# Patient Record
Sex: Female | Born: 1940 | Race: White | Hispanic: No | Smoking: Never smoker
Health system: Southern US, Community
[De-identification: ages and names within clinical notes are randomized; demographics above are authoritative.]

## PROBLEM LIST (undated history)

## (undated) DIAGNOSIS — Z87442 Personal history of urinary calculi: Secondary | ICD-10-CM

## (undated) DIAGNOSIS — K8020 Calculus of gallbladder without cholecystitis without obstruction: Secondary | ICD-10-CM

## (undated) DIAGNOSIS — N3010 Interstitial cystitis (chronic) without hematuria: Secondary | ICD-10-CM

## (undated) DIAGNOSIS — K5909 Other constipation: Secondary | ICD-10-CM

## (undated) DIAGNOSIS — E079 Disorder of thyroid, unspecified: Secondary | ICD-10-CM

## (undated) DIAGNOSIS — M1990 Unspecified osteoarthritis, unspecified site: Secondary | ICD-10-CM

## (undated) DIAGNOSIS — N189 Chronic kidney disease, unspecified: Secondary | ICD-10-CM

## (undated) DIAGNOSIS — N390 Urinary tract infection, site not specified: Secondary | ICD-10-CM

## (undated) HISTORY — DX: Other constipation: K59.09

## (undated) HISTORY — DX: Personal history of urinary calculi: Z87.442

## (undated) HISTORY — DX: Unspecified osteoarthritis, unspecified site: M19.90

## (undated) HISTORY — PX: PARTIAL HYSTERECTOMY: SHX80

## (undated) HISTORY — DX: Chronic kidney disease, unspecified: N18.9

## (undated) HISTORY — DX: Interstitial cystitis (chronic) without hematuria: N30.10

## (undated) HISTORY — PX: CATARACT EXTRACTION: SUR2

## (undated) HISTORY — PX: EYE SURGERY: SHX253

## (undated) HISTORY — DX: Calculus of gallbladder without cholecystitis without obstruction: K80.20

## (undated) HISTORY — PX: CHOLECYSTECTOMY: SHX55

## (undated) HISTORY — DX: Urinary tract infection, site not specified: N39.0

## (undated) HISTORY — DX: Disorder of thyroid, unspecified: E07.9

## (undated) HISTORY — PX: TUBAL LIGATION: SHX77

---

## 2017-03-01 ENCOUNTER — Encounter: Payer: Self-pay | Admitting: Internal Medicine

## 2017-03-01 LAB — COLOGUARD: Cologuard: NEGATIVE

## 2018-06-12 ENCOUNTER — Encounter: Payer: Self-pay | Admitting: Internal Medicine

## 2019-03-20 ENCOUNTER — Non-Acute Institutional Stay: Payer: Medicare Other | Admitting: Nurse Practitioner

## 2019-03-20 ENCOUNTER — Other Ambulatory Visit: Payer: Self-pay

## 2019-03-20 DIAGNOSIS — K219 Gastro-esophageal reflux disease without esophagitis: Secondary | ICD-10-CM | POA: Insufficient documentation

## 2019-03-20 DIAGNOSIS — N952 Postmenopausal atrophic vaginitis: Secondary | ICD-10-CM

## 2019-03-20 DIAGNOSIS — Z7189 Other specified counseling: Secondary | ICD-10-CM

## 2019-03-20 DIAGNOSIS — E039 Hypothyroidism, unspecified: Secondary | ICD-10-CM

## 2019-03-20 DIAGNOSIS — K5901 Slow transit constipation: Secondary | ICD-10-CM

## 2019-03-20 DIAGNOSIS — Z87440 Personal history of urinary (tract) infections: Secondary | ICD-10-CM

## 2019-03-20 DIAGNOSIS — E559 Vitamin D deficiency, unspecified: Secondary | ICD-10-CM | POA: Diagnosis not present

## 2019-03-20 MED ORDER — PANTOPRAZOLE SODIUM 40 MG PO TBEC: 40.0000 mg | DELAYED_RELEASE_TABLET | Freq: Every day | ORAL | 3 refills | Status: DC

## 2019-03-20 NOTE — Patient Instructions (Signed)
F/u in clinic [place] in 3 months. Obtain CBC/diff, CMP/eGFR, TSH, Lipid panel, Vit D next week.

## 2019-03-20 NOTE — Assessment & Plan Note (Signed)
Many treatments in the past.

## 2019-03-20 NOTE — Assessment & Plan Note (Signed)
Stable, on Lactulose 29ml po qd.

## 2019-03-20 NOTE — Assessment & Plan Note (Signed)
03/20/19 MOST limited additional interventions, determine use or limitation of antibiotics when infection occurs, IVF for a defined trail period.

## 2019-03-20 NOTE — Assessment & Plan Note (Signed)
Failed Pepcid, will try Pantoprazole 40mg  qd.

## 2019-03-20 NOTE — Progress Notes (Signed)
Provider:  Marlana Latus NP Location:   clinic [place]    Place of Service:  Clinic (12)clinic [facility]  PCP: System, Pcp Not In Patient Care Team: System, Pcp Not In as PCP - General  Extended Emergency Contact Information Primary Emergency Contact: Lyda, [place] Mobile Phone: [phone] Relation: Son  Code Status: DNR Goals of Care: Advanced Directive information No flowsheet data found.    Chief Complaint  Patient presents with  . New Admit To SNF    Patient here today to establish care.     HPI: Patient is a 78 y.o. female seen today for admission to [facility] service  The patient has Hx of Hypothyroidism, taking Levothyroxine 57mg qd, no recent TSH. Constipation, stable, on Lactulose 150mqd. GERD failed Pepcid. Atrophic Vaginitis, 3x/wk, PV Estradiol. She takes Vit D OTC for supplement.   History reviewed. No pertinent past medical history.   has no history on file for tobacco, alcohol, and drug. Social History   Socioeconomic History  . Marital status: Widowed    Spouse name: Not on file  . Number of children: Not on file  . Years of education: Not on file  . Highest education level: Not on file  Occupational History  . Not on file  Social Needs  . Financial resource strain: Not on file  . Food insecurity    Worry: Not on file    Inability: Not on file  . Transportation needs    Medical: Not on file    Non-medical: Not on file  Tobacco Use  . Smoking status: Not on file  Substance and Sexual Activity  . Alcohol use: Not on file  . Drug use: Not on file  . Sexual activity: Not on file  Lifestyle  . Physical activity    Days per week: Not on file    Minutes per session: Not on file  . Stress: Not on file  Relationships  . Social coHerbalistn phone: Not on file    Gets together: Not on file    Attends religious service: Not on file    Active member of club or organization: Not on file    Attends meetings of clubs or organizations: Not on file   Relationship status: Not on file  . Intimate partner violence    Fear of current or ex partner: Not on file    Emotionally abused: Not on file    Physically abused: Not on file    Forced sexual activity: Not on file  Other Topics Concern  . Not on file  Social History Narrative  . Not on file    Functional Status Survey:    History reviewed. No pertinent family history.  Health Maintenance  Topic Date Due  . TETANUS/TDAP  11/07/1957  . DEXA SCAN  11/08/2003  . PNA vac Low Risk Adult (1 of 2 - PCV13) 11/08/2003  . INFLUENZA VACCINE  Completed    Not on File  Allergies as of 03/20/2019   Not on File     Medication List       Accurate as of March 20, 2019 11:59 PM. If you have any questions, ask your nurse or doctor.        levothyroxine 25 MCG tablet Commonly known as: SYNTHROID Take 25 mcg by mouth daily before breakfast.   pantoprazole 40 MG tablet Commonly known as: PROTONIX Take 1 tablet (40 mg total) by mouth daily. Started by: Amra Shukla X Kaylin Schellenberg, NP  Review of Systems  Constitutional: Negative for activity change, appetite change, chills, diaphoresis and fatigue.  HENT: Positive for hearing loss. Negative for congestion and voice change.   Respiratory: Negative for cough, shortness of breath and wheezing.   Cardiovascular: Negative for chest pain, palpitations and leg swelling.  Gastrointestinal: Negative for abdominal distention, constipation, diarrhea, nausea and vomiting.       Heart burns, bloated sometimes.   Genitourinary: Negative for difficulty urinating, dysuria, frequency, hematuria, urgency, vaginal bleeding, vaginal discharge and vaginal pain.  Musculoskeletal: Negative for gait problem.  Skin: Negative for color change and pallor.  Neurological: Negative for dizziness, speech difficulty, weakness and headaches.  Psychiatric/Behavioral: Negative for agitation, behavioral problems, hallucinations and sleep disturbance. The patient is not  nervous/anxious.     Vitals:   03/20/19 1534  BP: 126/78  Pulse: 82  Temp: (!) 97.1 F (36.2 C)  SpO2: 97%  Weight: 141 lb (64 kg)   There is no height or weight on file to calculate BMI. Physical Exam Vitals signs and nursing note reviewed.  Constitutional:      General: She is not in acute distress.    Appearance: Normal appearance. She is normal weight. She is not ill-appearing, toxic-appearing or diaphoretic.  HENT:     Head: Normocephalic and atraumatic.     Nose: Nose normal.     Mouth/Throat:     Mouth: Mucous membranes are moist.  Eyes:     Extraocular Movements: Extraocular movements intact.     Conjunctiva/sclera: Conjunctivae normal.     Pupils: Pupils are equal, round, and reactive to light.  Neck:     Musculoskeletal: Normal range of motion and neck supple.  Cardiovascular:     Rate and Rhythm: Normal rate and regular rhythm.     Heart sounds: No murmur.  Pulmonary:     Breath sounds: Normal breath sounds. No wheezing, rhonchi or rales.  Abdominal:     General: Bowel sounds are normal. There is no distension.     Palpations: Abdomen is soft.     Tenderness: There is no abdominal tenderness. There is no right CVA tenderness, left CVA tenderness, guarding or rebound.  Musculoskeletal:     Right lower leg: No edema.     Left lower leg: No edema.     Comments: kyphoscoliosis   Skin:    General: Skin is warm and dry.  Neurological:     General: No focal deficit present.     Mental Status: She is alert and oriented to person, place, and time. Mental status is at baseline.     Cranial Nerves: No cranial nerve deficit.     Motor: No weakness.     Coordination: Coordination normal.     Gait: Gait normal.  Psychiatric:        Mood and Affect: Mood normal.        Behavior: Behavior normal.        Thought Content: Thought content normal.        Judgment: Judgment normal.     Labs reviewed: Basic Metabolic Panel: No results for input(s): NA, K, CL, CO2,  GLUCOSE, BUN, CREATININE, CALCIUM, MG, PHOS in the last 8760 hours. Liver Function Tests: No results for input(s): AST, ALT, ALKPHOS, BILITOT, PROT, ALBUMIN in the last 8760 hours. No results for input(s): LIPASE, AMYLASE in the last 8760 hours. No results for input(s): AMMONIA in the last 8760 hours. CBC: No results for input(s): WBC, NEUTROABS, HGB, HCT, MCV, PLT in the last  8760 hours. Cardiac Enzymes: No results for input(s): CKTOTAL, CKMB, CKMBINDEX, TROPONINI in the last 8760 hours. BNP: Invalid input(s): POCBNP No results found for: HGBA1C No results found for: TSH No results found for: VITAMINB12 No results found for: FOLATE No results found for: IRON, TIBC, FERRITIN  Imaging and Procedures obtained prior to SNF admission: Patient was never admitted.  Assessment/Plan  Advance care planning 03/20/19 MOST limited additional interventions, determine use or limitation of antibiotics when infection occurs, IVF for a defined trail period.   Hypothyroidism Taking Levothyroxine 78mg qd, no TSH this year, her previous PCP Dr. [place]Adalberto [place] # [phone] Update TSH  Vitamin D deficiency Taking Vit D supplement at home, will update Vit D level.   Atrophic vaginitis Estradiol 3x/wk PV  History of recurrent UTIs Many treatments in the past.   Slow transit constipation Stable, on Lactulose 188mpo qd.   GERD (gastroesophageal reflux disease) Failed Pepcid, will try Pantoprazole 4048md.   Family/ staff Communication: plan of care reviewed with the patient and charge nurse.   Labs/tests ordered: CBC/diff, CMP/eGFR, TSH, Lipid panel, Vit D next week

## 2019-03-20 NOTE — Assessment & Plan Note (Signed)
Estradiol 3x/wk PV

## 2019-03-20 NOTE — Assessment & Plan Note (Signed)
Taking Levothyroxine 56mcg qd, no TSH this year, her previous PCP Dr. Adalberto [place] # [phone]. Update TSH

## 2019-03-20 NOTE — Assessment & Plan Note (Signed)
Taking Vit D supplement at home, will update Vit D level.

## 2019-03-24 ENCOUNTER — Encounter: Payer: Self-pay | Admitting: Nurse Practitioner

## 2019-04-01 ENCOUNTER — Encounter: Payer: Medicare Other | Admitting: Nurse Practitioner

## 2019-04-01 ENCOUNTER — Other Ambulatory Visit: Payer: Self-pay

## 2019-04-01 DIAGNOSIS — E039 Hypothyroidism, unspecified: Secondary | ICD-10-CM

## 2019-04-01 DIAGNOSIS — E559 Vitamin D deficiency, unspecified: Secondary | ICD-10-CM

## 2019-04-03 ENCOUNTER — Telehealth: Payer: Self-pay

## 2019-04-03 MED ORDER — FAMOTIDINE 20 MG PO TABS: 20.0000 mg | ORAL_TABLET | Freq: Every day | ORAL | 2 refills | Status: DC

## 2019-04-03 NOTE — Telephone Encounter (Signed)
LMOM to return call call with decision on calling in new medication.   Facility no longer is able to dispose of old medications.   Patient called clinic back before closing. She agreed to famotidine and confirmed pharm. Rx sent.

## 2019-04-03 NOTE — Telephone Encounter (Signed)
Patient was a walk in into the clinic stating after reading about the pantoprazole, she is concerned she should not be taking it. According to the patient, it states it is not good for people with osteopenia and she is, possibly. She would like something else.   She would also like to know does the facility have a safe way of disposing medications?

## 2019-04-04 LAB — CBC WITH DIFFERENTIAL/PLATELET
Absolute Monocytes: 347 cells/uL (ref 200–950)
Basophils Absolute: 51 cells/uL (ref 0–200)
Basophils Relative: 1.3 %
Eosinophils Absolute: 160 cells/uL (ref 15–500)
Eosinophils Relative: 4.1 %
HCT: 40 % (ref 35.0–45.0)
Hemoglobin: 13.7 g/dL (ref 11.7–15.5)
Lymphs Abs: 1147 cells/uL (ref 850–3900)
MCH: 31.6 pg (ref 27.0–33.0)
MCHC: 34.3 g/dL (ref 32.0–36.0)
MCV: 92.4 fL (ref 80.0–100.0)
MPV: 10.1 fL (ref 7.5–12.5)
Monocytes Relative: 8.9 %
Neutro Abs: 2196 cells/uL (ref 1500–7800)
Neutrophils Relative %: 56.3 %
Platelets: 249 10*3/uL (ref 140–400)
RBC: 4.33 10*6/uL (ref 3.80–5.10)
RDW: 12 % (ref 11.0–15.0)
Total Lymphocyte: 29.4 %
WBC: 3.9 10*3/uL (ref 3.8–10.8)

## 2019-04-04 LAB — COMPLETE METABOLIC PANEL WITH GFR
AG Ratio: 1.8 (calc) (ref 1.0–2.5)
ALT: 24 U/L (ref 6–29)
AST: 25 U/L (ref 10–35)
Albumin: 4.2 g/dL (ref 3.6–5.1)
Alkaline phosphatase (APISO): 59 U/L (ref 37–153)
BUN: 10 mg/dL (ref 7–25)
CO2: 26 mmol/L (ref 20–32)
Calcium: 9.1 mg/dL (ref 8.6–10.4)
Chloride: 101 mmol/L (ref 98–110)
Creat: 0.68 mg/dL (ref 0.60–0.88)
GFR, Est African American: 96 mL/min/{1.73_m2} (ref >=60)
GFR, Est Non African American: 83 mL/min/{1.73_m2} (ref >=60)
Globulin: 2.4 g/dL (calc) (ref 1.9–3.7)
Glucose, Bld: 109 mg/dL — ABNORMAL HIGH (ref 65–99)
Potassium: 4.4 mmol/L (ref 3.5–5.3)
Sodium: 136 mmol/L (ref 135–146)
Total Bilirubin: 0.4 mg/dL (ref 0.2–1.2)
Total Protein: 6.6 g/dL (ref 6.1–8.1)

## 2019-04-04 LAB — VITAMIN D 1,25 DIHYDROXY
Vitamin D 1, 25 (OH)2 Total: 45 pg/mL (ref 18–72)
Vitamin D2 1, 25 (OH)2: <8 pg/mL
Vitamin D3 1, 25 (OH)2: 45 pg/mL

## 2019-04-04 LAB — LIPID PANEL
Cholesterol: 212 mg/dL — ABNORMAL HIGH (ref <200)
HDL: 71 mg/dL (ref >=50)
LDL Cholesterol (Calc): 120 mg/dL (calc) — ABNORMAL HIGH
Non-HDL Cholesterol (Calc): 141 mg/dL (calc) — ABNORMAL HIGH (ref <130)
Total CHOL/HDL Ratio: 3 (calc) (ref <5.0)
Triglycerides: 104 mg/dL (ref <150)

## 2019-04-04 LAB — TSH: TSH: 5.82 mIU/L — ABNORMAL HIGH (ref 0.40–4.50)

## 2019-04-08 ENCOUNTER — Encounter: Payer: Self-pay | Admitting: Nurse Practitioner

## 2019-04-08 ENCOUNTER — Other Ambulatory Visit: Payer: Self-pay | Admitting: Nurse Practitioner

## 2019-04-08 DIAGNOSIS — E785 Hyperlipidemia, unspecified: Secondary | ICD-10-CM | POA: Insufficient documentation

## 2019-04-08 DIAGNOSIS — E039 Hypothyroidism, unspecified: Secondary | ICD-10-CM

## 2019-04-08 NOTE — Progress Notes (Signed)
This encounter was created in error - please disregard.

## 2019-05-29 ENCOUNTER — Non-Acute Institutional Stay: Payer: Medicare Other | Admitting: Nurse Practitioner

## 2019-05-29 ENCOUNTER — Telehealth: Payer: Self-pay

## 2019-05-29 ENCOUNTER — Other Ambulatory Visit: Payer: Self-pay

## 2019-05-29 VITALS — BP 138/82 | HR 79 | Temp 96.8°F | Ht 62.0 in | Wt 144.0 lb

## 2019-05-29 DIAGNOSIS — K219 Gastro-esophageal reflux disease without esophagitis: Secondary | ICD-10-CM | POA: Diagnosis not present

## 2019-05-29 DIAGNOSIS — K5901 Slow transit constipation: Secondary | ICD-10-CM

## 2019-05-29 DIAGNOSIS — E785 Hyperlipidemia, unspecified: Secondary | ICD-10-CM | POA: Diagnosis not present

## 2019-05-29 DIAGNOSIS — E039 Hypothyroidism, unspecified: Secondary | ICD-10-CM | POA: Diagnosis not present

## 2019-05-29 DIAGNOSIS — R635 Abnormal weight gain: Secondary | ICD-10-CM | POA: Insufficient documentation

## 2019-05-29 MED ORDER — FAMOTIDINE 20 MG PO TABS: 20.0000 mg | ORAL_TABLET | Freq: Two times a day (BID) | ORAL | 2 refills | Status: DC

## 2019-05-29 NOTE — Assessment & Plan Note (Signed)
Stable, continue Lactulose

## 2019-05-29 NOTE — Assessment & Plan Note (Signed)
LDL 120 04/01/19, declined statin

## 2019-05-29 NOTE — Assessment & Plan Note (Signed)
Baseline weight #130s, about #10 Ibs weight gain since moving in to [place] [facility], diet/exercise, will check TSH Free T3/T4.

## 2019-05-29 NOTE — Assessment & Plan Note (Signed)
Better, but not well controlled, will increase Famotidine 20mg  bid. Observe.

## 2019-05-29 NOTE — Assessment & Plan Note (Signed)
TSH 5.82 03/27/19, continue Levothyroxine qd, update TSH, Free T3//T4

## 2019-05-29 NOTE — Telephone Encounter (Signed)
Patient was a no show to clinic. Called patient to reschedule, LMOM to return call.

## 2019-05-29 NOTE — Progress Notes (Signed)
Location:   clinic [place]   Place of Service:  Clinic (12) Provider: Marlana Latus NP  Code Status: DNR Goals of Care: No flowsheet data found.   Chief Complaint  Patient presents with  . Medical Management of Chronic Issues    3 month follow up and discuss labs from November.      HPI: Patient is a 79 y.o. female seen today for medical management of chronic diseases.    The patient has Hx of GERD, better, but not well controled, on  Famotidine 20mg  qd. Hypothyroidism, TSH 5s 03/2019, on Levothyroxine 19mcg qd, TSH 5.82 04/01/19. LDL 120. C/o weight gained about #10Ibs since moved into [place] [facility], no apparent fluid retention.    No past medical history on file.  No past surgical history on file.  No Known Allergies  Allergies as of 05/29/2019   No Known Allergies     Medication List       Accurate as of May 29, 2019 11:59 PM. If you have any questions, ask your nurse or doctor.        STOP taking these medications   pantoprazole 40 MG tablet Commonly known as: PROTONIX Stopped by: Urvi Imes X Khadeem Rockett, NP     TAKE these medications   estradiol 0.5 MG tablet Commonly known as: ESTRACE Take 0.25 mg by mouth daily. 3 times a week   famotidine 20 MG tablet Commonly known as: PEPCID Take 1 tablet (20 mg total) by mouth 2 (two) times daily. What changed: when to take this Changed by: Alani Sabbagh X Lamari Beckles, NP   levothyroxine 25 MCG tablet Commonly known as: SYNTHROID Take 25 mcg by mouth daily before breakfast.       Review of Systems:  Review of Systems  Constitutional: Positive for unexpected weight change. Negative for activity change, appetite change, chills, diaphoresis, fatigue and fever.  HENT: Positive for hearing loss. Negative for congestion and voice change.   Eyes: Negative for visual disturbance.  Respiratory: Negative for cough, shortness of breath and wheezing.   Cardiovascular: Negative for chest pain, palpitations and leg swelling.  Gastrointestinal: Negative for  abdominal distention, abdominal pain, constipation, diarrhea, nausea and vomiting.       Heart burns  Genitourinary: Negative for difficulty urinating, dysuria and urgency.       Urination x1/night.   Musculoskeletal: Negative for gait problem.  Skin: Negative for color change and pallor.  Neurological: Negative for dizziness, speech difficulty, weakness and headaches.  Psychiatric/Behavioral: Positive for sleep disturbance. Negative for agitation, behavioral problems and hallucinations. The patient is not nervous/anxious.        5 hours average sleep at night.     Health Maintenance  Topic Date Due  . TETANUS/TDAP  11/07/1957  . DEXA SCAN  11/08/2003  . PNA vac Low Risk Adult (1 of 2 - PCV13) 11/08/2003  . INFLUENZA VACCINE  Completed    Physical Exam: Vitals:   05/29/19 1425  BP: 138/82  Pulse: 79  Temp: (!) 96.8 F (36 C)  SpO2: 98%  Weight: 144 lb (65.3 kg)  Height: 5\' 2"  (1.575 m)   Body mass index is 26.34 kg/m. Physical Exam Vitals and nursing note reviewed.  Constitutional:      General: She is not in acute distress.    Appearance: Normal appearance. She is not ill-appearing, toxic-appearing or diaphoretic.     Comments: Over weight.   HENT:     Head: Normocephalic and atraumatic.     Nose: Nose normal.  Mouth/Throat:     Mouth: Mucous membranes are moist.  Eyes:     Extraocular Movements: Extraocular movements intact.     Conjunctiva/sclera: Conjunctivae normal.     Pupils: Pupils are equal, round, and reactive to light.  Cardiovascular:     Rate and Rhythm: Normal rate and regular rhythm.     Heart sounds: No murmur.  Pulmonary:     Effort: Pulmonary effort is normal.     Breath sounds: No wheezing, rhonchi or rales.  Abdominal:     General: Bowel sounds are normal. There is no distension.     Palpations: Abdomen is soft.     Tenderness: There is no abdominal tenderness. There is no right CVA tenderness, left CVA tenderness, guarding or rebound.    Musculoskeletal:     Cervical back: Normal range of motion and neck supple.     Right lower leg: No edema.     Left lower leg: No edema.  Skin:    General: Skin is warm and dry.  Neurological:     General: No focal deficit present.     Mental Status: She is alert and oriented to person, place, and time. Mental status is at baseline.     Motor: No weakness.     Coordination: Coordination normal.     Gait: Gait normal.  Psychiatric:        Mood and Affect: Mood normal.        Behavior: Behavior normal.        Thought Content: Thought content normal.        Judgment: Judgment normal.     Labs reviewed: Basic Metabolic Panel: Recent Labs    04/01/19 0700  NA 136  K 4.4  CL 101  CO2 26  GLUCOSE 109*  BUN 10  CREATININE 0.68  CALCIUM 9.1  TSH 5.82*   Liver Function Tests: Recent Labs    04/01/19 0700  AST 25  ALT 24  BILITOT 0.4  PROT 6.6   No results for input(s): LIPASE, AMYLASE in the last 8760 hours. No results for input(s): AMMONIA in the last 8760 hours. CBC: Recent Labs    04/01/19 0700  WBC 3.9  NEUTROABS 2,196  HGB 13.7  HCT 40.0  MCV 92.4  PLT 249   Lipid Panel: Recent Labs    04/01/19 0700  CHOL 212*  HDL 71  LDLCALC 120*  TRIG 104  CHOLHDL 3.0   No results found for: HGBA1C  Procedures since last visit: No results found.  Assessment/Plan  GERD (gastroesophageal reflux disease) Better, but not well controlled, will increase Famotidine 20mg  bid. Observe.   Slow transit constipation Stable, continue Lactulose   Hypothyroidism TSH 5.82 03/27/19, continue Levothyroxine 13/5/20 qd, update TSH, Free T3//T4  Hyperlipidemia LDL 120 04/01/19, declined statin  Weight gain Baseline weight #130s, about #10 Ibs weight gain since moving in to [place] [facility], diet/exercise, will check TSH Free T3/T4.    Labs/tests ordered: pending TSH Free T4, T3 4 weeks.   Next appt:  4 months

## 2019-05-29 NOTE — Patient Instructions (Addendum)
Diet, exercise, weight monitoring, check Free T3/T4, TSH in 4 weeks. F/u in clinic in 4 months.

## 2019-05-30 ENCOUNTER — Encounter: Payer: Self-pay | Admitting: Nurse Practitioner

## 2019-06-26 ENCOUNTER — Non-Acute Institutional Stay: Payer: Medicare Other

## 2019-06-26 ENCOUNTER — Other Ambulatory Visit: Payer: Self-pay

## 2019-06-26 DIAGNOSIS — E039 Hypothyroidism, unspecified: Secondary | ICD-10-CM

## 2019-06-27 LAB — TSH+FREE T4: TSH W/REFLEX TO FT4: 6.26 mIU/L — ABNORMAL HIGH (ref 0.40–4.50)

## 2019-06-27 LAB — T3, FREE: T3, Free: 2.7 pg/mL (ref 2.3–4.2)

## 2019-06-27 LAB — T4, FREE: Free T4: 1 ng/dL (ref 0.8–1.8)

## 2019-07-01 ENCOUNTER — Other Ambulatory Visit: Payer: Self-pay | Admitting: Nurse Practitioner

## 2019-07-01 DIAGNOSIS — E039 Hypothyroidism, unspecified: Secondary | ICD-10-CM

## 2019-07-01 MED ORDER — LEVOTHYROXINE SODIUM 75 MCG PO TABS: 37.5000 ug | ORAL_TABLET | Freq: Every day | ORAL | 3 refills | Status: DC

## 2019-07-17 ENCOUNTER — Other Ambulatory Visit: Payer: Self-pay

## 2019-07-17 MED ORDER — LACTULOSE 10 GM/15ML PO SOLN: 10.0000 g | Freq: Every day | ORAL | 1 refills | Status: DC

## 2019-07-24 ENCOUNTER — Other Ambulatory Visit: Payer: Self-pay

## 2019-07-24 MED ORDER — LACTULOSE 10 GM/15ML PO SOLN: 10.0000 g | Freq: Every day | ORAL | 3 refills | Status: DC

## 2019-09-25 ENCOUNTER — Non-Acute Institutional Stay: Payer: Medicare Other | Admitting: Nurse Practitioner

## 2019-09-25 ENCOUNTER — Encounter: Payer: Self-pay | Admitting: Gastroenterology

## 2019-09-25 ENCOUNTER — Encounter: Payer: Self-pay | Admitting: Nurse Practitioner

## 2019-09-25 ENCOUNTER — Other Ambulatory Visit: Payer: Self-pay

## 2019-09-25 DIAGNOSIS — E039 Hypothyroidism, unspecified: Secondary | ICD-10-CM

## 2019-09-25 DIAGNOSIS — E785 Hyperlipidemia, unspecified: Secondary | ICD-10-CM | POA: Diagnosis not present

## 2019-09-25 DIAGNOSIS — K219 Gastro-esophageal reflux disease without esophagitis: Secondary | ICD-10-CM

## 2019-09-25 DIAGNOSIS — K5901 Slow transit constipation: Secondary | ICD-10-CM

## 2019-09-25 NOTE — Assessment & Plan Note (Signed)
LDL 120 04/01/19, diet and exercise, declined statin.

## 2019-09-25 NOTE — Assessment & Plan Note (Addendum)
Same weight #144Ibs as in Jan, but more than her usual wt of #130Ibs, failed increase taking 06/26/19 TSH 6.26, levothyroxine was increased to 37.29mcg qd, update TSH 09/18/19 prior to the next appointment. Will start taking Levothyroxine 37.67mcg qd, update TSH 6-8 wks prior to the next appointment.

## 2019-09-25 NOTE — Assessment & Plan Note (Addendum)
Not new but worse bloated, swollen stomach, feels "burning sensation", occasional nauseated, last episode was 6 months ago, better after went to bed.  continue Famotidine, may consider GI referral, no further colonoscopy per pt, but doing coloGuard.

## 2019-09-25 NOTE — Assessment & Plan Note (Signed)
Lactulose

## 2019-09-25 NOTE — Patient Instructions (Addendum)
Start taking Levothyroxine 37.59mcg daily, repeat TSH prior to the next appointment Dr. Chales Abrahams 3-4 months. Will consider GI referral for bloated abd with non specific symptoms.

## 2019-09-25 NOTE — Progress Notes (Signed)
Location:   clinic [facility]   Place of Service:  Clinic (12) Provider: Chipper Oman NP  Code Status: DNR Goals of Care: No flowsheet data found.   Chief Complaint  Patient presents with  . Medical Management of Chronic Issues    Patient returns to clinic for follow up. She complains of still having acid issues. She is due for eye exam. She does not currently see an eye doctor. Patient would like to discuss getting a mammogram and a stool test. She believes she may be due for TDAP.   Marland Kitchen Health Maintenance    TDAP, PNA vaccinations patient unsure of last ones.     HPI: Patient is a 79 y.o. female seen today for medical management of chronic diseases.    Hx of GERD, stable, on Famotidine 20mg  bid. Postmenopausal symptoms, managed with Estradiol 0.25mg  qd. Hypothyroidism, trended up TSH from 03/2019 to 06/2019 slightly, the patient is more concern of her weight, on Levothyroxine 07/2019 qd(it was supposed to increase to 06/2019 37.1mcg qd, TSH 5.82 04/01/19, pending TSH ordered 07/01/19 not done.   History reviewed. No pertinent past medical history.  History reviewed. No pertinent surgical history.  No Known Allergies  Allergies as of 09/25/2019   No Known Allergies     Medication List       Accurate as of Sep 25, 2019 11:59 PM. If you have any questions, ask your nurse or doctor.        estradiol 0.5 MG tablet Commonly known as: ESTRACE Take 0.25 mg by mouth daily. 3 times a week   famotidine 20 MG tablet Commonly known as: PEPCID Take 1 tablet (20 mg total) by mouth 2 (two) times daily.   lactulose 10 GM/15ML solution Commonly known as: CHRONULAC Take 15 mLs (10 g total) by mouth at bedtime.   levothyroxine 75 MCG tablet Commonly known as: SYNTHROID Take 0.5 tablets (37.5 mcg total) by mouth daily before breakfast.       Review of Systems:  Review of Systems  Constitutional: Positive for unexpected weight change. Negative for activity change, appetite change, fatigue and  fever.  HENT: Positive for hearing loss. Negative for congestion and voice change.   Eyes: Negative for visual disturbance.  Respiratory: Negative for cough and shortness of breath.   Cardiovascular: Negative for leg swelling.  Gastrointestinal: Positive for abdominal distention. Negative for abdominal pain, constipation, diarrhea, nausea and vomiting.       Feels bloated, swollen,  burning sensation occasionally, nauseated last Christmas,   Genitourinary: Negative for difficulty urinating, dysuria and urgency.       Urination x1/night.   Musculoskeletal: Negative for gait problem.  Skin: Negative for color change and pallor.  Neurological: Negative for speech difficulty, weakness and light-headedness.  Psychiatric/Behavioral: Negative for agitation, behavioral problems and sleep disturbance. The patient is not nervous/anxious.        5 hours average sleep at night.     Health Maintenance  Topic Date Due  . TETANUS/TDAP  Never done  . DEXA SCAN  Never done  . PNA vac Low Risk Adult (1 of 2 - PCV13) Never done  . INFLUENZA VACCINE  12/21/2019  . COVID-19 Vaccine  Completed    Physical Exam: Vitals:   09/25/19 1306  BP: 112/72  Pulse: 81  Temp: 97.7 F (36.5 C)  SpO2: 96%  Weight: 144 lb 9.6 oz (65.6 kg)  Height: 5\' 2"  (1.575 m)   Body mass index is 26.45 kg/m. Physical Exam Vitals and  nursing note reviewed.  Constitutional:      Appearance: Normal appearance.     Comments: Over weight.   HENT:     Head: Normocephalic and atraumatic.     Mouth/Throat:     Mouth: Mucous membranes are moist.  Eyes:     Extraocular Movements: Extraocular movements intact.     Conjunctiva/sclera: Conjunctivae normal.     Pupils: Pupils are equal, round, and reactive to light.  Cardiovascular:     Rate and Rhythm: Normal rate and regular rhythm.     Heart sounds: No murmur.  Pulmonary:     Effort: Pulmonary effort is normal.     Breath sounds: No rales.  Abdominal:     General:  Bowel sounds are normal. There is no distension.     Palpations: Abdomen is soft.     Tenderness: There is no abdominal tenderness. There is no right CVA tenderness, left CVA tenderness, guarding or rebound.     Comments: Shifting dullness on percussion.   Musculoskeletal:     Cervical back: Normal range of motion and neck supple.     Right lower leg: No edema.     Left lower leg: No edema.  Skin:    General: Skin is warm and dry.  Neurological:     General: No focal deficit present.     Mental Status: She is alert and oriented to person, place, and time. Mental status is at baseline.     Motor: No weakness.     Coordination: Coordination normal.     Gait: Gait normal.  Psychiatric:        Mood and Affect: Mood normal.        Behavior: Behavior normal.        Thought Content: Thought content normal.        Judgment: Judgment normal.     Labs reviewed: Basic Metabolic Panel: Recent Labs    04/01/19 0700  NA 136  K 4.4  CL 101  CO2 26  GLUCOSE 109*  BUN 10  CREATININE 0.68  CALCIUM 9.1  TSH 5.82*   Liver Function Tests: Recent Labs    04/01/19 0700  AST 25  ALT 24  BILITOT 0.4  PROT 6.6   No results for input(s): LIPASE, AMYLASE in the last 8760 hours. No results for input(s): AMMONIA in the last 8760 hours. CBC: Recent Labs    04/01/19 0700  WBC 3.9  NEUTROABS 2,196  HGB 13.7  HCT 40.0  MCV 92.4  PLT 249   Lipid Panel: Recent Labs    04/01/19 0700  CHOL 212*  HDL 71  LDLCALC 120*  TRIG 104  CHOLHDL 3.0   No results found for: HGBA1C  Procedures since last visit: No results found.  Assessment/Plan  Hypothyroidism Same weight #144Ibs as in Jan, but more than her usual wt of #130Ibs, failed increase taking 06/26/19 TSH 6.26, levothyroxine was increased to 37.34mcg qd, update TSH 09/18/19 prior to the next appointment. Will start taking Levothyroxine 37.70mcg qd, update TSH 6-8 wks prior to the next appointment.   Hyperlipidemia LDL 120  04/01/19, diet and exercise, declined statin.   Slow transit constipation Lactulose.   GERD (gastroesophageal reflux disease) Not new but worse bloated, swollen stomach, feels "burning sensation", occasional nauseated, last episode was 6 months ago, better after went to bed.  continue Famotidine, may consider GI referral, no further colonoscopy per pt, but doing coloGuard.    Labs/tests ordered:  TSH 6-8 wks prior to the  next appointment  Next appt:  4 months with Dr. Chales Abrahams.

## 2019-09-26 ENCOUNTER — Encounter: Payer: Self-pay | Admitting: Nurse Practitioner

## 2019-10-14 ENCOUNTER — Encounter: Payer: Self-pay | Admitting: Gastroenterology

## 2019-10-14 ENCOUNTER — Ambulatory Visit (INDEPENDENT_AMBULATORY_CARE_PROVIDER_SITE_OTHER): Payer: Medicare Other | Admitting: Gastroenterology

## 2019-10-14 VITALS — BP 123/77 | HR 72 | Ht 62.0 in | Wt 138.2 lb

## 2019-10-14 DIAGNOSIS — K5909 Other constipation: Secondary | ICD-10-CM | POA: Diagnosis not present

## 2019-10-14 MED ORDER — LINACLOTIDE 145 MCG PO CAPS
145.0000 ug | ORAL_CAPSULE | Freq: Every day | ORAL | 5 refills | Status: DC
Start: 2019-10-14 — End: 2020-01-26

## 2019-10-14 NOTE — Progress Notes (Signed)
10/14/2019 Claudia Hunt [id number] Jan 10, 1941   HISTORY OF PRESENT ILLNESS: This is a pleasant 79 year old female who is new to our practice.  She actually just moved to the [place] area from [place], [place].  She was referred here by [facility] care in order to discuss issues with constipation.  She tells me that she had tried MiraLAX for quite some time and it was not much help.  She then took Amitiza for a little while, unsure of the dosing, but says that it worked extremely well for her.  She stopped taking it after a while though because it is very expensive for her with her insurance.  Now she is currently taking lactulose, which causes her a lot of GI distress.  She tells me she has never had a colonoscopy in the past.  She says that she had some type of CT scan performed over 20 years ago they told her she had diverticulosis.  She says that she has a very irritable bowel.  She has had 2 negative Cologuards, the last in October 2018 and then one 3 years prior to that.  She says that she was told at one point that due to her abdominal surgeries that she probably had a lot of scar tissue and may not be able to have a colonoscopy.  She describes lower abdominal discomfort/burning and bloating.  Says that the symptoms get better after having a bowel movement.  She denies seeing any blood in her stools.  She denies any weight loss.   Past Medical History:  Diagnosis Date  . Arthritis   . Chronic constipation   . CKD (chronic kidney disease)   . Gallstones   . History of kidney stones   . Interstitial cystitis   . Thyroid disease   . UTI (urinary tract infection)    Past Surgical History:  Procedure Laterality Date  . CHOLECYSTECTOMY    . PARTIAL HYSTERECTOMY    . TUBAL LIGATION      reports that she has never smoked. She has never used smokeless tobacco. She reports that she does not drink alcohol or use drugs. family history includes Cancer in her father;  Colon cancer in her paternal uncle; Diabetes in her father; Prostate cancer in her brother. Allergies  Allergen Reactions  . Ciprofloxacin   . Diflucan [Fluconazole]   . Sulfa Antibiotics       Outpatient Encounter Medications as of 10/14/2019  Medication Sig  . calcium carbonate (OSCAL) 1500 (600 Ca) MG TABS tablet Take 600 mg of elemental calcium by mouth daily with breakfast.  . Cholecalciferol (EQL VITAMIN D3) 50 MCG (2000 UT) CAPS Take 2,000 Units by mouth daily.  Marland Kitchen estradiol (ESTRACE) 0.5 MG tablet Take 0.25 mg by mouth daily. 3 times a week  . famotidine (PEPCID) 20 MG tablet Take 1 tablet (20 mg total) by mouth 2 (two) times daily.  . Glucosamine-Chondroit-Vit C-Mn (GLUCOSAMINE 1500 COMPLEX PO) Take 1,500 mg by mouth daily.  Marland Kitchen lactulose (CHRONULAC) 10 GM/15ML solution Take 15 mLs (10 g total) by mouth at bedtime.  Marland Kitchen levothyroxine (SYNTHROID) 75 MCG tablet Take 0.5 tablets (37.5 mcg total) by mouth daily before breakfast.  . Omega-3 Fatty Acids (FISH OIL PO) Take 2,000 mg by mouth daily.   No facility-administered encounter medications on file as of 10/14/2019.     REVIEW OF SYSTEMS  : All other systems reviewed and negative except where noted in the History of Present Illness.   PHYSICAL  EXAM: BP 123/77   Pulse 72   Ht 5\' 2"  (1.575 m)   Wt 138 lb 3.2 oz (62.7 kg)   SpO2 96%   BMI 25.28 kg/m  General: Well developed white female in no acute distress Head: Normocephalic and atraumatic Eyes:  Sclerae anicteric, conjunctiva pink. Ears: Normal auditory acuity Lungs: Clear throughout to auscultation; no increased WOB. Heart: Regular rate and rhythm; no M/R/G. Abdomen: Soft, non-distended.  BS present.  Non-tender.  Small non-tender umbilical hernia noted. Musculoskeletal: Symmetrical with no gross deformities  Skin: No lesions on visible extremities Extremities: No edema  Neurological: Alert oriented x 4, grossly non-focal Psychological:  Alert and cooperative. Normal  mood and affect  ASSESSMENT AND PLAN: *Chronic constipation with associated lower abdominal discomfort and bloating: Had good results with Amitiza in the past, but was expensive with her insurance.  Is on lactulose currently and it causes a lot of GI distress.  Is looking for something different to try.  We will try Linzess 145 mcg daily.  Can titrate up or down if needed.  Samples and prescription were given.  She will call us back in 4 weeks with an update on her symptoms and sooner if needed. *Colorectal cancer screening: Has never had colonoscopy in the past.  Has had 2 negative Cologuard's.  Is due for Cologuard again in October 2021.  We will put in a recall for that.  Advised that this would likely be her last screening.  CC:  Mast, Man X, NP

## 2019-10-14 NOTE — Patient Instructions (Signed)
If you are age 81 or older, your body mass index should be between 23-30. Your Body mass index is 25.28 kg/m. If this is out of the aforementioned range listed, please consider follow up with your Primary Care Provider.  If you are age 74 or younger, your body mass index should be between 19-25. Your Body mass index is 25.28 kg/m. If this is out of the aformentioned range listed, please consider follow up with your Primary Care Provider.   We have sent the following medications to your pharmacy for you to pick up at your convenience: Linzess 145 mcg daily (samples provided).   Will mail you a letter in October for a repeat Cologuard.   Call back in 4 weeks with an update or sooner if needed ask for Hilma Favors, RN.

## 2019-10-15 NOTE — Progress Notes (Signed)
I agree with the above note, plan however I don't think she needs colon cancer screening in 02/2020.  She will be 79 at that time.

## 2019-10-28 ENCOUNTER — Other Ambulatory Visit: Payer: Self-pay | Admitting: *Deleted

## 2019-10-28 ENCOUNTER — Telehealth: Payer: Self-pay | Admitting: Gastroenterology

## 2019-10-28 DIAGNOSIS — E039 Hypothyroidism, unspecified: Secondary | ICD-10-CM

## 2019-10-28 MED ORDER — LEVOTHYROXINE SODIUM 75 MCG PO TABS: 37.5000 ug | ORAL_TABLET | Freq: Every day | ORAL | 1 refills | Status: DC

## 2019-10-28 NOTE — Telephone Encounter (Signed)
Patient states the linzess is working well for her and would like to try Amitiza instead also said the linzess is too expensive.

## 2019-10-28 NOTE — Telephone Encounter (Signed)
Received request from [facility] mail Order

## 2019-10-28 NOTE — Telephone Encounter (Signed)
The pt has been taking linzess and states it caused her to have diarrhea.  She also states that it is too expensive. She says she has tried [place] before but stopped due to cost.  I advised her to call her insurance company and see which is covered best and we can discuss with Shanda Bumps if it can be sent to the pharmacy. Pt agreed

## 2019-11-04 NOTE — Telephone Encounter (Signed)
FYI

## 2019-11-05 NOTE — Telephone Encounter (Signed)
Miralax 2 or 3 times a day may be a better option as the lactulose can cause a lot of gas and bloating.  Not sure if she had tried Miralax that many times a day in the past.  Thank you,  Sharlynn Oliphant

## 2019-11-05 NOTE — Telephone Encounter (Signed)
The pt will try miralax 3 times daily and call back if she does not have a favorable response.

## 2019-11-13 ENCOUNTER — Other Ambulatory Visit: Payer: Self-pay | Admitting: Nurse Practitioner

## 2020-01-20 ENCOUNTER — Other Ambulatory Visit: Payer: Self-pay

## 2020-01-20 DIAGNOSIS — E039 Hypothyroidism, unspecified: Secondary | ICD-10-CM

## 2020-01-20 LAB — TSH: TSH: 4.8 mIU/L — ABNORMAL HIGH (ref 0.40–4.50)

## 2020-01-23 ENCOUNTER — Other Ambulatory Visit: Payer: Self-pay

## 2020-01-23 ENCOUNTER — Non-Acute Institutional Stay: Payer: Medicare Other | Admitting: Internal Medicine

## 2020-01-23 ENCOUNTER — Encounter: Payer: Self-pay | Admitting: Internal Medicine

## 2020-01-23 VITALS — BP 142/90 | HR 93 | Temp 98.0°F | Ht 62.0 in | Wt 140.8 lb

## 2020-01-23 DIAGNOSIS — E039 Hypothyroidism, unspecified: Secondary | ICD-10-CM

## 2020-01-23 DIAGNOSIS — N952 Postmenopausal atrophic vaginitis: Secondary | ICD-10-CM

## 2020-01-23 DIAGNOSIS — K5901 Slow transit constipation: Secondary | ICD-10-CM | POA: Diagnosis not present

## 2020-01-23 DIAGNOSIS — K219 Gastro-esophageal reflux disease without esophagitis: Secondary | ICD-10-CM | POA: Diagnosis not present

## 2020-01-23 DIAGNOSIS — E785 Hyperlipidemia, unspecified: Secondary | ICD-10-CM

## 2020-01-23 DIAGNOSIS — Z1231 Encounter for screening mammogram for malignant neoplasm of breast: Secondary | ICD-10-CM

## 2020-01-23 DIAGNOSIS — M8580 Other specified disorders of bone density and structure, unspecified site: Secondary | ICD-10-CM

## 2020-01-23 DIAGNOSIS — Z0000 Encounter for general adult medical examination without abnormal findings: Secondary | ICD-10-CM

## 2020-01-23 DIAGNOSIS — H3530 Unspecified macular degeneration: Secondary | ICD-10-CM

## 2020-01-23 MED ORDER — TETANUS-DIPHTH-ACELL PERTUSSIS 5-2.5-18.5 LF-MCG/0.5 IM SUSP: 0.5000 mL | Freq: Once | INTRAMUSCULAR | 0 refills | Status: AC

## 2020-01-23 MED ORDER — PNEUMOCOCCAL 13-VAL CONJ VACC IM SUSP: 0.5000 mL | INTRAMUSCULAR | 0 refills | Status: AC

## 2020-01-23 NOTE — Progress Notes (Signed)
[facility]:  [facility] Service:  Clinic (12)  Provider:   Code Status:  Goals of Care: No flowsheet data found.   Chief Complaint  Patient presents with  . Medical Management of Chronic Issues    Patient returns to the clinic for follow up.   Marland Kitchen Health Maintenance    Dexa scan, TDAP, PCV13    HPI: Patient is a 79 y.o. female seen today for medical management of chronic diseases.    Patient has a history of GERD Is taking Tums as needed Hypothyroidism Recent change in dose of TSH Constipation Was not able to afford Linzess.  Is taking MiraLAX every other day and is working Is planning to get Cologuard from GI  Uses estradiol vaginal suppositories to prevent UTIs Wants to discuss breast imaging for screening and DEXA  Is independent.  Lives in [place].  Takes care of her brother also.  Has 1 son in [place] who is the POA Past Medical History:  Diagnosis Date  . Arthritis   . Chronic constipation   . CKD (chronic kidney disease)   . Gallstones   . History of kidney stones   . Interstitial cystitis   . Thyroid disease   . UTI (urinary tract infection)     Past Surgical History:  Procedure Laterality Date  . CHOLECYSTECTOMY    . PARTIAL HYSTERECTOMY    . TUBAL LIGATION      Allergies  Allergen Reactions  . Ciprofloxacin   . Diflucan [Fluconazole]   . Sulfa Antibiotics     Outpatient Encounter Medications as of 01/23/2020  Medication Sig  . aspirin EC 81 MG tablet Take 81 mg by mouth daily.  . calcium carbonate (OSCAL) 1500 (600 Ca) MG TABS tablet Take 600 mg of elemental calcium by mouth daily with breakfast.  . Cholecalciferol (EQL VITAMIN D3) 50 MCG (2000 UT) CAPS Take 2,000 Units by mouth daily.  Marland Kitchen estradiol (ESTRACE) 0.5 MG tablet Take 0.25 mg by mouth daily. 3 times a week  . famotidine (PEPCID) 20 MG tablet Take 1 tablet (20 mg total) by mouth 2 (two) times daily.  . Glucosamine-Chondroit-Vit C-Mn (GLUCOSAMINE 1500 COMPLEX PO)  Take 1,500 mg by mouth daily.  Marland Kitchen lactulose (CHRONULAC) 10 GM/15ML solution TAKE 15 MLS (10 G TOTAL) BY MOUTH AT BEDTIME.  Marland Kitchen levothyroxine (SYNTHROID) 75 MCG tablet Take 0.5 tablets (37.5 mcg total) by mouth daily before breakfast.  . linaclotide (LINZESS) 145 MCG CAPS capsule Take 1 capsule (145 mcg total) by mouth daily before breakfast.  . Multiple Vitamin (MULTIVITAMIN ADULT PO) Take by mouth.  . multivitamin-lutein (OCUVITE-LUTEIN) CAPS capsule Take 1 capsule by mouth daily.  . Omega-3 Fatty Acids (FISH OIL PO) Take 2,000 mg by mouth daily.  . Probiotic Product (FLORAJEN3 PO) Take 460 mg by mouth daily.   No facility-administered encounter medications on file as of 01/23/2020.    Review of Systems:  Review of Systems  Review of Systems  Constitutional: Negative for activity change, appetite change, chills, diaphoresis, fatigue and fever.  HENT: Negative for mouth sores, postnasal drip, rhinorrhea, sinus pain and sore throat.   Respiratory: Negative for apnea, cough, chest tightness, shortness of breath and wheezing.   Cardiovascular: Negative for chest pain, palpitations and leg swelling.  Gastrointestinal: Negative for abdominal distention, abdominal pain, constipation, diarrhea, nausea and vomiting.  Genitourinary: Negative for dysuria and frequency.  Musculoskeletal: Negative for arthralgias, joint swelling and myalgias.  Skin: Negative for rash.  Neurological: Negative for  dizziness, syncope, weakness, light-headedness and numbness.  Psychiatric/Behavioral: Negative for behavioral problems, confusion and sleep disturbance.     Health Maintenance  Topic Date Due  . TETANUS/TDAP  Never done  . DEXA SCAN  Never done  . PNA vac Low Risk Adult (1 of 2 - PCV13) Never done  . INFLUENZA VACCINE  12/21/2019  . COVID-19 Vaccine  Completed    Physical Exam: Vitals:   01/23/20 1025  BP: (!) 142/90  Pulse: 93  Temp: 98 F (36.7 C)  SpO2: 95%  Weight: 140 lb 12.8 oz (63.9 kg)    Height: 5\' 2"  (1.575 m)   Body mass index is 25.75 kg/m. Physical Exam  Constitutional: Oriented to person, place, and time. Well-developed and well-nourished.  HENT:  Head: Normocephalic.  Mouth/Throat: Oropharynx is clear and moist.  Eyes: Pupils are equal, round, and reactive to light.  Neck: Neck supple.  Cardiovascular: Normal rate and normal heart sounds.  No murmur heard. Pulmonary/Chest: Effort normal and breath sounds normal. No respiratory distress. No wheezes. She has no rales.  Abdominal: Soft. Bowel sounds are normal. No distension. There is no tenderness. There is no rebound.  Musculoskeletal: No edema.  Lymphadenopathy: none Neurological: Alert and oriented to person, place, and time.  Skin: Skin is warm and dry.  Psychiatric: Normal mood and affect. Behavior is normal. Thought content normal.    Labs reviewed: Basic Metabolic Panel: Recent Labs    04/01/19 0700 01/19/20 0838  NA 136  --   K 4.4  --   CL 101  --   CO2 26  --   GLUCOSE 109*  --   BUN 10  --   CREATININE 0.68  --   CALCIUM 9.1  --   TSH 5.82* 4.80*   Liver Function Tests: Recent Labs    04/01/19 0700  AST 25  ALT 24  BILITOT 0.4  PROT 6.6   No results for input(s): LIPASE, AMYLASE in the last 8760 hours. No results for input(s): AMMONIA in the last 8760 hours. CBC: Recent Labs    04/01/19 0700  WBC 3.9  NEUTROABS 2,196  HGB 13.7  HCT 40.0  MCV 92.4  PLT 249   Lipid Panel: Recent Labs    04/01/19 0700  CHOL 212*  HDL 71  LDLCALC 120*  TRIG 104  CHOLHDL 3.0   No results found for: HGBA1C  Procedures since last visit: No results found.  Assessment/Plan Hypothyroidism, unspecified type Synthroid dose Changed Repeat TSH showed much better Will repeat again before Appointment Hyperlipidemia, unspecified hyperlipidemia type Mildily Elevated but HDL os Good No risk Would need for Primary Prevention Does not want right now Slow transit constipation Taking  Miralax QOD Keeping the Lactulose for PRN  Gastroesophageal reflux disease, unspecified whether esophagitis present Tums PRn Atrophic vaginitis [place] [facility] Health care maintenance Order Ophthalmology consult for previus history of Macular Degeneration Mammogram And DEXA TDAP and Prevnar 13 Cologuard Per GI  Labs/tests ordered:  * No order type specified * Next appt:  Visit date not found

## 2020-01-26 MED ORDER — LACTULOSE 10 GM/15ML PO SOLN: 10.0000 g | Freq: Every day | ORAL | 1 refills | Status: DC

## 2020-01-29 ENCOUNTER — Other Ambulatory Visit: Payer: Self-pay | Admitting: Internal Medicine

## 2020-01-29 DIAGNOSIS — Z1231 Encounter for screening mammogram for malignant neoplasm of breast: Secondary | ICD-10-CM

## 2020-01-29 DIAGNOSIS — M8580 Other specified disorders of bone density and structure, unspecified site: Secondary | ICD-10-CM

## 2020-02-17 ENCOUNTER — Encounter (INDEPENDENT_AMBULATORY_CARE_PROVIDER_SITE_OTHER): Payer: Medicare Other | Admitting: Ophthalmology

## 2020-02-26 NOTE — Progress Notes (Addendum)
[facility] Note  03/01/2020     CHIEF COMPLAINT Patient presents for Retina Evaluation   HISTORY OF PRESENT ILLNESS: Claudia Hunt is a 79 y.o. female who presents to the clinic today for:   HPI    Retina Evaluation    In both eyes.  This started 1 year ago.  Duration of 1 year.  Associated Symptoms Floaters.  Negative for Flashes, Distortion, Blind Spot, Pain, Redness, Photophobia, Glare, Scalp Tenderness, Trauma, Shoulder/Hip pain, Jaw Claudication, Fever, Weight Loss and Fatigue.  Context:  distance vision.  Treatments tried include no treatments.  I, the attending physician,  performed the HPI with the patient and updated documentation appropriately.          Comments    79 y/o female pt referred by her PCP, Dr. Veleta Miners for ret eval.  LEE x1 yr in [place], [place], where pt was told to be monitored for macular degeneration, although she states she was told at the time she did not have it.  No family hx of eye disease.  Pt's VA good OU [place].  Denies pain, FOL, but has a few floaters OU.  No gtts.       Last edited by Bernarda Caffey, MD on 03/01/2020  4:44 PM. (History)    Patient was referred by Dr. Lyndel Safe for retinal evaluation. Pt moved to [place] from [place], in 2020. History of ERM OU, pt states she saw a retina specialist in [place] in the past. States vision is good OU.  Referring physician: Virgie Dad, MD [place],  [place] [id number]  HISTORICAL INFORMATION:   Selected notes from the MEDICAL RECORD NUMBER Referred by Dr. Lyndel Safe LEE:  Ocular Hx- PMH-    CURRENT MEDICATIONS: No current outpatient medications on file. (Ophthalmic Drugs)   No current facility-administered medications for this visit. (Ophthalmic Drugs)   Current Outpatient Medications (Other)  Medication Sig   aspirin EC 81 MG tablet Take 81 mg by mouth daily.   calcium carbonate (OSCAL) 1500 (600 Ca) MG TABS tablet Take 600 mg of elemental  calcium by mouth daily with breakfast.   Cholecalciferol (EQL VITAMIN D3) 50 MCG (2000 UT) CAPS Take 2,000 Units by mouth daily.   estradiol (ESTRACE) 0.5 MG tablet 0.25 mg daily. 3 times a week vaginally   Glucosamine-Chondroit-Vit C-Mn (GLUCOSAMINE 1500 COMPLEX PO) Take 1,500 mg by mouth daily.   lactulose (CHRONULAC) 10 GM/15ML solution Take 15 mLs (10 g total) by mouth at bedtime.   levothyroxine (SYNTHROID) 75 MCG tablet Take 0.5 tablets (37.5 mcg total) by mouth daily before breakfast.   Multiple Vitamin (MULTIVITAMIN ADULT PO) Take by mouth.   multivitamin-lutein (OCUVITE-LUTEIN) CAPS capsule Take 1 capsule by mouth daily.   Omega-3 Fatty Acids (FISH OIL PO) Take 2,000 mg by mouth daily.   polyethylene glycol (MIRALAX / GLYCOLAX) 17 g packet Take 17 g by mouth. Every other night   Probiotic Product (FLORAJEN3 PO) Take 460 mg by mouth daily.   No current facility-administered medications for this visit. (Other)      REVIEW OF SYSTEMS: ROS    Positive for: Gastrointestinal, Genitourinary, Musculoskeletal, Eyes   Negative for: Constitutional, Neurological, Skin, HENT, Endocrine, Cardiovascular, Respiratory, Psychiatric, Allergic/Imm, Heme/Lymph   Last edited by Matthew Folks, COA on 03/01/2020  9:24 AM. (History)       ALLERGIES Allergies  Allergen Reactions   Ciprofloxacin    Diflucan [Fluconazole]    Sulfa Antibiotics  PAST MEDICAL HISTORY Past Medical History:  Diagnosis Date   Arthritis    Chronic constipation    CKD (chronic kidney disease)    Gallstones    History of kidney stones    Interstitial cystitis    Thyroid disease    UTI (urinary tract infection)    Past Surgical History:  Procedure Laterality Date   CATARACT EXTRACTION Bilateral    CHOLECYSTECTOMY     EYE SURGERY Bilateral    Cat Sx   PARTIAL HYSTERECTOMY     TUBAL LIGATION      FAMILY HISTORY Family History  Problem Relation Age of Onset   Cancer Father     Diabetes Father    Prostate cancer Brother    Colon cancer Paternal Uncle     SOCIAL HISTORY Social History   Tobacco Use   Smoking status: Never Smoker   Smokeless tobacco: Never Used  Substance Use Topics   Alcohol use: Never   Drug use: Never         OPHTHALMIC EXAM:  Base Eye Exam    Visual Acuity (Snellen - Linear)      Right Left   Dist [place] 20/20 -2 20/20 -2       Tonometry (Tonopen, 9:27 AM)      Right Left   Pressure 12 12       Pupils      Dark Light Shape React APD   Right 2 1.5 Round Minimal 0   Left 2 1.5 Round Minimal 0       Visual Fields (Counting fingers)      Left Right    Full Full       Extraocular Movement      Right Left    Full, Ortho Full, Ortho       Neuro/Psych    Oriented x3: Yes   Mood/Affect: Normal       Dilation    Both eyes: 1.0% Mydriacyl, 2.5% Phenylephrine @ 9:30 AM        Slit Lamp and Fundus Exam    Slit Lamp Exam      Right Left   Lids/Lashes Dermatochalasis, mild MGD Dermatochalasis   Conjunctiva/Sclera white and quiet white and quiet   Cornea mild arcus mild arcus   Anterior Chamber deep and clear deep and clear   Iris round--moderate dilation 5 mm round--moderate dilation 5 mm   Lens well centered PCIOL, 1+PCO noncentral, nasal well centered PCIOL   Vitreous syneresis, PVD syneresis       Fundus Exam      Right Left   Disc mild pallor, sharp rim, +PPA mild pallor, sharp rim, + mild temporal PPA   C/D Ratio 0.1 0.2   Macula Flat,blunted foveal reflex, mild RPE mottling and clumping, mlld ERM with trace cystic changes, no heme Flat,blunted foveal reflex, mild RPE mottling and clumping, mlld ERM with trace cystic changes, no heme   Vessels Vascular attenuation, mild tortuoisity Vascular attenuation, mild tortuoisity   Periphery attached, no heme attached, no heme        Refraction    Manifest Refraction      Sphere Cylinder Dist VA   Right +0.50 Sphere 20/20-   Left +0.50 Sphere 20/20-           IMAGING AND PROCEDURES  Imaging and Procedures for 03/01/2020  OCT, Retina - OU - Both Eyes       Right Eye Quality was good. Central Foveal Thickness: 277. Progression has no prior  data. Findings include normal foveal contour, epiretinal membrane, vitreomacular adhesion , no SRF, intraretinal fluid.   Left Eye Quality was good. Central Foveal Thickness: 336. Progression has no prior data. Findings include abnormal foveal contour, intraretinal fluid, no SRF, epiretinal membrane, vitreomacular adhesion .   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 CME - cystoid macular edema. PED - pigment epithelial detachment. IRF - intraretinal fluid. SRF - subretinal fluid. EZ - ellipsoid zone. ERM - epiretinal membrane. ORA - outer retinal atrophy. ORT - outer retinal tubulation. SRHM - subretinal hyper-reflective material. IRHM - intraretinal hyper-reflective material                 ASSESSMENT/PLAN:    ICD-10-CM   1. 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 Retinal edema  H35.81 OCT, Retina - OU - Both Eyes  4. Essential hypertension  I10   5. Hypertensive retinopathy of both eyes  H35.033   6. Pseudophakia of both eyes  Z96.1     1-3. 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  - mild ERM OU, mild cystic changes / foveoschisis type changes OU (OS > OS)  - asymptomatic, no metamorphopsia  - BCVA 20/20-2 OU today  - no indication for surgery at this time  -  monitor for now  - f/u 3-4 mos -- DFE/OCT, possible FA  4,5. Hypertensive retinopathy OU - discussed importance of tight BP control - monitor  6. Pseudophakia OU  - s/p CE/IOL OU (in [place])  - IOLs in good position, doing well  - monitor   Ophthalmic Meds Ordered this visit:  No orders of the defined types were placed in this encounter.  This document serves as a record of services personally performed by Gardiner Sleeper, MD, PhD. It was created on their behalf by Leeann Must, [place], an ophthalmic technician. The creation of this record is the provider's dictation and/or activities during the visit.    Electronically signed by: Leeann Must, COA 10.07.2021 4:45 PM   This document serves as a record of services personally performed by Gardiner Sleeper, MD, PhD. It was created on their behalf by Roselee Nova, COMT. The creation of this record is the provider's dictation and/or activities during the visit.  Electronically signed by: Roselee Nova, COMT 03/01/20 4:45 PM  Gardiner Sleeper, M.D., Ph.D. Diseases & Surgery of the Retina and [place] 03/01/2020   I have reviewed the above documentation for accuracy and completeness, and I agree with the above. Gardiner Sleeper, M.D., Ph.D. 03/01/20 4:45 PM   Abbreviations: M myopia (nearsighted); A astigmatism; H hyperopia (farsighted); P presbyopia; Mrx spectacle prescription;  CTL contact lenses; OD right eye; OS left eye; OU both eyes  XT exotropia; ET esotropia; PEK punctate epithelial keratitis; PEE punctate epithelial erosions; DES dry eye syndrome; MGD meibomian gland dysfunction; ATs artificial tears; PFAT's preservative free artificial tears; Palmer nuclear sclerotic cataract; PSC posterior subcapsular cataract; ERM epi-retinal membrane; PVD posterior vitreous detachment; RD retinal detachment; DM diabetes mellitus; DR diabetic  retinopathy; NPDR non-proliferative diabetic retinopathy; PDR  proliferative diabetic retinopathy; CSME clinically significant macular edema; DME diabetic macular edema; dbh dot blot hemorrhages; CWS cotton wool spot; POAG primary open angle glaucoma; C/D cup-to-disc ratio; HVF humphrey visual field; GVF goldmann visual field; OCT optical coherence tomography; IOP intraocular pressure; BRVO Branch retinal vein occlusion; CRVO central retinal vein occlusion; CRAO central retinal artery occlusion; BRAO branch retinal artery occlusion; RT retinal tear; SB scleral buckle; PPV pars plana vitrectomy; VH Vitreous hemorrhage; PRP panretinal laser photocoagulation; IVK intravitreal kenalog; VMT vitreomacular traction; MH Macular hole;  NVD neovascularization of the disc; NVE neovascularization elsewhere; AREDS age related eye disease study; ARMD age related macular degeneration; POAG primary open angle glaucoma; EBMD epithelial/anterior basement membrane dystrophy; ACIOL anterior chamber intraocular lens; IOL intraocular lens; PCIOL posterior chamber intraocular lens; Phaco/IOL phacoemulsification with intraocular lens placement; [place] photorefractive keratectomy; LASIK laser assisted in situ keratomileusis; HTN hypertension; DM diabetes mellitus; COPD chronic obstructive pulmonary disease

## 2020-02-27 ENCOUNTER — Telehealth: Payer: Self-pay | Admitting: *Deleted

## 2020-02-27 NOTE — Telephone Encounter (Signed)
-----   Message from Mariane Duval, [place] sent at 10/14/2019  3:54 PM EDT ----- Needs cologuard 10/21

## 2020-02-27 NOTE — Telephone Encounter (Signed)
Faxed order to Cologuard

## 2020-03-01 ENCOUNTER — Other Ambulatory Visit: Payer: Self-pay

## 2020-03-01 ENCOUNTER — Ambulatory Visit (INDEPENDENT_AMBULATORY_CARE_PROVIDER_SITE_OTHER): Payer: Medicare Other | Admitting: Ophthalmology

## 2020-03-01 ENCOUNTER — Encounter (INDEPENDENT_AMBULATORY_CARE_PROVIDER_SITE_OTHER): Payer: Self-pay | Admitting: Ophthalmology

## 2020-03-01 DIAGNOSIS — H3581 Retinal edema: Secondary | ICD-10-CM | POA: Diagnosis not present

## 2020-03-01 DIAGNOSIS — Z961 Presence of intraocular lens: Secondary | ICD-10-CM

## 2020-03-01 DIAGNOSIS — H35033 Hypertensive retinopathy, bilateral: Secondary | ICD-10-CM

## 2020-03-01 DIAGNOSIS — H35373 Puckering of macula, bilateral: Secondary | ICD-10-CM | POA: Diagnosis not present

## 2020-03-01 DIAGNOSIS — H33193 Other retinoschisis and retinal cysts, bilateral: Secondary | ICD-10-CM | POA: Diagnosis not present

## 2020-03-01 DIAGNOSIS — I10 Essential (primary) hypertension: Secondary | ICD-10-CM

## 2020-03-05 ENCOUNTER — Other Ambulatory Visit: Payer: Self-pay | Admitting: Internal Medicine

## 2020-03-05 DIAGNOSIS — E039 Hypothyroidism, unspecified: Secondary | ICD-10-CM

## 2020-03-18 LAB — COLOGUARD: Cologuard: NEGATIVE

## 2020-03-23 ENCOUNTER — Other Ambulatory Visit: Payer: Self-pay | Admitting: Internal Medicine

## 2020-03-23 ENCOUNTER — Other Ambulatory Visit: Payer: Self-pay | Admitting: *Deleted

## 2020-03-23 NOTE — Telephone Encounter (Signed)
Please add medication. I cannot bring up Estradiol Suppositories in Epic.

## 2020-03-23 NOTE — Telephone Encounter (Signed)
Can someone call her and ask if she is on Vaginal Insert Imvexxy or Vaginal Tabs ? Also is it QD as needed or she uses it everyday. ? I need to know the quantity.

## 2020-03-23 NOTE — Telephone Encounter (Signed)
It should be changed to Suppositories

## 2020-03-23 NOTE — Telephone Encounter (Signed)
Received request from [facility] for refill for Estradiol Tablets.    OV note stated patient uses Suppositories. Current medication list has tablets.  0.25mg  daily. 3 times a week vaginally.  Please Advise.     Uses estradiol vaginal suppositories to prevent UTIs Wants to discuss breast imaging for screening and DEXA

## 2020-03-25 ENCOUNTER — Other Ambulatory Visit: Payer: Self-pay | Admitting: Internal Medicine

## 2020-03-25 MED ORDER — ESTRADIOL 1 MG PO TABS: 0.5000 mg | ORAL_TABLET | ORAL | 3 refills | Status: DC

## 2020-03-25 NOTE — Telephone Encounter (Signed)
Called and spoke with patient. She read off the medication bottle.  Estradiol 1mg   Insert 1/2 tablet vaginally three times a week.

## 2020-03-26 ENCOUNTER — Other Ambulatory Visit: Payer: Self-pay

## 2020-03-26 LAB — COLOGUARD: COLOGUARD: NEGATIVE

## 2020-03-26 NOTE — Telephone Encounter (Signed)
Message from [facility]  For Estradiol 1 mg Tab with conflicting directions: should this be: Take 1/2 tab by mouth 3x week or insert 1/2 tab vaginally 3x week? Please Clarify

## 2020-03-29 ENCOUNTER — Other Ambulatory Visit: Payer: Self-pay | Admitting: *Deleted

## 2020-03-29 MED ORDER — ESTRADIOL 1 MG PO TABS
ORAL_TABLET | ORAL | 3 refills | Status: DC
Start: 2020-03-29 — End: 2020-08-12

## 2020-03-29 NOTE — Telephone Encounter (Signed)
Received refill Clarification from [facility]. Patient is to insert vaginally not by mouth.  Confirmed instruction with patient.

## 2020-03-30 ENCOUNTER — Telehealth: Payer: Self-pay

## 2020-03-30 NOTE — Telephone Encounter (Signed)
-----   Message from Leta Baptist, PA-C sent at 03/30/2020  4:51 PM EST ----- Please let her know that her Cologuard stool study was once again negative.  Do not recommend any repeat due to age.  Thank you,  Jess

## 2020-03-31 NOTE — Telephone Encounter (Signed)
The patient has been notified of this information and all questions answered.

## 2020-04-22 ENCOUNTER — Other Ambulatory Visit: Payer: Self-pay

## 2020-04-22 DIAGNOSIS — E785 Hyperlipidemia, unspecified: Secondary | ICD-10-CM

## 2020-04-22 DIAGNOSIS — E039 Hypothyroidism, unspecified: Secondary | ICD-10-CM

## 2020-04-22 DIAGNOSIS — K219 Gastro-esophageal reflux disease without esophagitis: Secondary | ICD-10-CM

## 2020-04-22 DIAGNOSIS — N952 Postmenopausal atrophic vaginitis: Secondary | ICD-10-CM

## 2020-04-22 DIAGNOSIS — K5901 Slow transit constipation: Secondary | ICD-10-CM

## 2020-04-23 LAB — CBC WITH DIFFERENTIAL/PLATELET
Absolute Monocytes: 447 cells/uL (ref 200–950)
Basophils Absolute: 52 cells/uL (ref 0–200)
Basophils Relative: 1.1 %
Eosinophils Absolute: 169 cells/uL (ref 15–500)
Eosinophils Relative: 3.6 %
HCT: 39.7 % (ref 35.0–45.0)
Hemoglobin: 13.5 g/dL (ref 11.7–15.5)
Lymphs Abs: 1260 cells/uL (ref 850–3900)
MCH: 31.5 pg (ref 27.0–33.0)
MCHC: 34 g/dL (ref 32.0–36.0)
MCV: 92.5 fL (ref 80.0–100.0)
MPV: 10 fL (ref 7.5–12.5)
Monocytes Relative: 9.5 %
Neutro Abs: 2773 cells/uL (ref 1500–7800)
Neutrophils Relative %: 59 %
Platelets: 262 10*3/uL (ref 140–400)
RBC: 4.29 10*6/uL (ref 3.80–5.10)
RDW: 12.1 % (ref 11.0–15.0)
Total Lymphocyte: 26.8 %
WBC: 4.7 10*3/uL (ref 3.8–10.8)

## 2020-04-23 LAB — COMPLETE METABOLIC PANEL WITH GFR
AG Ratio: 1.7 (calc) (ref 1.0–2.5)
ALT: 22 U/L (ref 6–29)
AST: 24 U/L (ref 10–35)
Albumin: 4.2 g/dL (ref 3.6–5.1)
Alkaline phosphatase (APISO): 68 U/L (ref 37–153)
BUN: 10 mg/dL (ref 7–25)
CO2: 23 mmol/L (ref 20–32)
Calcium: 9.5 mg/dL (ref 8.6–10.4)
Chloride: 104 mmol/L (ref 98–110)
Creat: 0.74 mg/dL (ref 0.60–0.88)
GFR, Est African American: 88 mL/min/{1.73_m2} (ref >=60)
GFR, Est Non African American: 76 mL/min/{1.73_m2} (ref >=60)
Globulin: 2.5 g/dL (calc) (ref 1.9–3.7)
Glucose, Bld: 94 mg/dL (ref 65–99)
Potassium: 4.7 mmol/L (ref 3.5–5.3)
Sodium: 140 mmol/L (ref 135–146)
Total Bilirubin: 0.4 mg/dL (ref 0.2–1.2)
Total Protein: 6.7 g/dL (ref 6.1–8.1)

## 2020-04-23 LAB — LIPID PANEL
Cholesterol: 193 mg/dL (ref <200)
HDL: 69 mg/dL (ref >=50)
LDL Cholesterol (Calc): 107 mg/dL (calc) — ABNORMAL HIGH
Non-HDL Cholesterol (Calc): 124 mg/dL (calc) (ref <130)
Total CHOL/HDL Ratio: 2.8 (calc) (ref <5.0)
Triglycerides: 79 mg/dL (ref <150)

## 2020-04-23 LAB — TSH: TSH: 3.34 mIU/L (ref 0.40–4.50)

## 2020-05-17 ENCOUNTER — Other Ambulatory Visit: Payer: Self-pay

## 2020-05-17 ENCOUNTER — Ambulatory Visit
Admission: RE | Admit: 2020-05-17 | Discharge: 2020-05-17 | Disposition: A | Discharge status: Home / Self Care | Payer: Medicare Other | Source: Ambulatory Visit | Attending: Internal Medicine | Admitting: Internal Medicine

## 2020-05-17 ENCOUNTER — Other Ambulatory Visit: Payer: Medicare Other

## 2020-05-17 DIAGNOSIS — Z1231 Encounter for screening mammogram for malignant neoplasm of breast: Secondary | ICD-10-CM

## 2020-06-24 NOTE — Progress Notes (Signed)
[facility] - Clinic Note  06/29/2020     CHIEF COMPLAINT Patient presents for Retina Follow Up   HISTORY OF PRESENT ILLNESS: Claudia Hunt is a 80 y.o. female who presents to the clinic today for:   HPI    Retina Follow Up    Patient presents with  Other.  In both eyes.  This started months ago.  Severity is moderate.  Duration of months.  Since onset it is stable.  I, the attending physician,  performed the HPI with the patient and updated documentation appropriately.          Comments    Pt states vision is pretty good OU.  Pt denies eye pain or discomfort.  Patient still has floaters OU--denies any new floaters or worsening floaters.       Last edited by Rennis Chris, MD on 06/29/2020 11:08 PM. (History)      Referring physician: Mahlon Gammon, MD [address] [place],  [place] [address]  HISTORICAL INFORMATION:   Selected notes from the MEDICAL RECORD NUMBER Referred by Dr. Chales Abrahams   CURRENT MEDICATIONS: No current outpatient medications on file. (Ophthalmic Drugs)   No current facility-administered medications for this visit. (Ophthalmic Drugs)   Current Outpatient Medications (Other)  Medication Sig  . levothyroxine (SYNTHROID) 75 MCG tablet TAKE 1/2 TABLET (37.5 MCG TOTAL) BY MOUTH DAILY BEFORE BREAKFAST.  Marland Kitchen aspirin EC 81 MG tablet Take 81 mg by mouth daily.  . calcium carbonate (OSCAL) 1500 (600 Ca) MG TABS tablet Take 600 mg of elemental calcium by mouth daily with breakfast.  . Cholecalciferol (EQL VITAMIN D3) 50 MCG (2000 UT) CAPS Take 2,000 Units by mouth daily.  Marland Kitchen estradiol (ESTRACE) 1 MG tablet Insert 1/2 tablet vaginally three times per week.  . Glucosamine-Chondroit-Vit C-Mn (GLUCOSAMINE 1500 COMPLEX PO) Take 1,500 mg by mouth daily.  Marland Kitchen lactulose (CHRONULAC) 10 GM/15ML solution Take 15 mLs (10 g total) by mouth at bedtime.  . Multiple Vitamin (MULTIVITAMIN ADULT PO) Take by mouth.  . multivitamin-lutein (OCUVITE-LUTEIN) CAPS  capsule Take 1 capsule by mouth daily.  . Omega-3 Fatty Acids (FISH OIL PO) Take 2,000 mg by mouth daily.  . polyethylene glycol (MIRALAX / GLYCOLAX) 17 g packet Take 17 g by mouth. Every other night  . Probiotic Product (FLORAJEN3 PO) Take 460 mg by mouth daily.   No current facility-administered medications for this visit. (Other)      REVIEW OF SYSTEMS: ROS    Positive for: Gastrointestinal, Genitourinary, Musculoskeletal, Eyes   Negative for: Constitutional, Neurological, Skin, HENT, Endocrine, Cardiovascular, Respiratory, Psychiatric, Allergic/Imm, Heme/Lymph   Last edited by Corrinne Eagle on 06/29/2020 10:01 AM. (History)       ALLERGIES Allergies  Allergen Reactions  . Ciprofloxacin   . Diflucan [Fluconazole]   . Sulfa Antibiotics     PAST MEDICAL HISTORY Past Medical History:  Diagnosis Date  . Arthritis   . Chronic constipation   . CKD (chronic kidney disease)   . Gallstones   . History of kidney stones   . Interstitial cystitis   . Thyroid disease   . UTI (urinary tract infection)    Past Surgical History:  Procedure Laterality Date  . CATARACT EXTRACTION Bilateral   . CHOLECYSTECTOMY    . EYE SURGERY Bilateral    Cat Sx  . PARTIAL HYSTERECTOMY    . TUBAL LIGATION      FAMILY HISTORY Family History  Problem Relation Age of Onset  . Cancer  Father   . Diabetes Father   . Prostate cancer Brother   . Colon cancer Paternal Uncle     SOCIAL HISTORY Social History   Tobacco Use  . Smoking status: Never Smoker  . Smokeless tobacco: Never Used  Substance Use Topics  . Alcohol use: Never  . Drug use: Never         OPHTHALMIC EXAM:  Base Eye Exam    Visual Acuity (Snellen - Linear)      Right Left   Dist [place] 20/20 -1 20/20 -2       Tonometry (Tonopen, 10:12 AM)      Right Left   Pressure 18 18       Pupils      Dark Light Shape React APD   Right 2 1 Round Minimal 0   Left 2 1 Round Minimal 0       Visual Fields      Left  Right    Full Full       Extraocular Movement      Right Left    Full Full       Neuro/Psych    Oriented x3: Yes   Mood/Affect: Normal       Dilation    Both eyes: 1.0% Mydriacyl, 2.5% Phenylephrine @ 10:12 AM        Slit Lamp and Fundus Exam    Slit Lamp Exam      Right Left   Lids/Lashes Dermatochalasis, mild MGD Dermatochalasis   Conjunctiva/Sclera white and quiet white and quiet   Cornea mild arcus mild arcus   Anterior Chamber deep and clear deep and clear   Iris round--moderate dilation to 4.75 mm round--moderate dilation 5 mm   Lens well centered PCIOL, 1+PCO noncentral, nasal well centered PCIOL   Vitreous syneresis, PVD, vitreous condensations inferiorly syneresis, Posterior vitreous detachment, vitreous condensations inferiorly       Fundus Exam      Right Left   Disc mild pallor, sharp rim, +PPA mild pallor, sharp rim, mild temporal PPA   C/D Ratio 0.1 0.2   Macula Flat,blunted foveal reflex, mild RPE mottling and clumping, mlld ERM with trace cystic changes, no heme Flat,blunted foveal reflex, mild RPE mottling and clumping, mild ERM with trace cystic changes, no heme   Vessels mild attenuation, tortuousity mild attenuation, tortuousity   Periphery attached, no heme attached, no heme          IMAGING AND PROCEDURES  Imaging and Procedures for 06/29/2020  OCT, Retina - OU - Both Eyes       Right Eye Quality was good. Central Foveal Thickness: 278. Progression has been stable. Findings include normal foveal contour, epiretinal membrane, vitreomacular adhesion , no SRF, intraretinal fluid.   Left Eye Quality was good. Central Foveal Thickness: 324. Progression has been stable. Findings include abnormal foveal contour, intraretinal fluid, no SRF, epiretinal membrane, vitreomacular adhesion .   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 CME - cystoid macular edema. PED - pigment epithelial detachment. IRF - intraretinal fluid. SRF - subretinal fluid. EZ - ellipsoid zone. ERM - epiretinal membrane. ORA - outer retinal atrophy. ORT - outer retinal tubulation. SRHM - subretinal hyper-reflective material. IRHM - intraretinal hyper-reflective material  ASSESSMENT/PLAN:    ICD-10-CM   1. 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 Retinal edema  H35.81 OCT, Retina - OU - Both Eyes  4. Essential hypertension  I10   5. Hypertensive retinopathy of both eyes  H35.033   6. Pseudophakia of both eyes  Z96.1     1-3. 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 Hypertensive retinopathy OU - discussed importance of tight BP control - monitor  6. Pseudophakia OU  - s/p CE/IOL OU (in [place])  - IOLs in good position, doing well  - monitor   Ophthalmic Meds Ordered this visit:  No orders of the defined types were placed in this encounter.  This document serves as a record of services personally performed by Karie Chimera, MD, PhD. It was created on their behalf by Cristopher Estimable, COT an ophthalmic technician. The creation of this record is the provider's dictation and/or activities during the visit.    Electronically signed by: Cristopher Estimable, COT 2.3.22 @ 11:14 PM   This document serves as a record of services personally performed by Karie Chimera, MD, PhD. It was created on their behalf by Glee Arvin. Manson Passey, OA an ophthalmic technician. The creation of this record is the provider's dictation and/or activities during the visit.    Electronically signed by: Glee Arvin. Manson Passey, [place] 02.08.2022 11:14  PM  Karie Chimera, M.D., Ph.D. Diseases & Surgery of the Retina and Vitreous [facility] 06/29/2020   I have reviewed the above documentation for accuracy and completeness, and I agree with the above. Karie Chimera, M.D., Ph.D. 06/29/20 11:14 PM   Abbreviations: M myopia (nearsighted); A astigmatism; H hyperopia (farsighted); P presbyopia; Mrx spectacle prescription;  CTL contact lenses; OD right eye; OS left eye; OU both eyes  XT exotropia; ET esotropia; PEK punctate epithelial keratitis; PEE punctate epithelial erosions; DES dry eye syndrome; MGD meibomian gland dysfunction; ATs artificial tears; PFAT's preservative free artificial tears; NSC nuclear sclerotic cataract; PSC posterior subcapsular cataract; ERM epi-retinal membrane; PVD posterior vitreous detachment; RD retinal detachment; DM diabetes mellitus; DR diabetic retinopathy; NPDR non-proliferative diabetic retinopathy; PDR proliferative diabetic retinopathy; CSME clinically significant macular edema; DME diabetic macular edema; dbh dot blot hemorrhages; CWS cotton wool spot; POAG primary open angle glaucoma; C/D cup-to-disc ratio; HVF humphrey visual field; GVF goldmann visual field; OCT optical coherence tomography; IOP intraocular pressure; BRVO Branch retinal vein occlusion; CRVO central retinal vein occlusion; CRAO central retinal artery occlusion; BRAO branch retinal artery occlusion; RT retinal tear; SB scleral buckle; PPV pars plana vitrectomy; VH Vitreous hemorrhage; PRP panretinal laser photocoagulation; IVK intravitreal kenalog; VMT vitreomacular traction; MH Macular hole;  NVD neovascularization of the disc; NVE neovascularization elsewhere; AREDS age related eye disease study; ARMD age related macular degeneration; POAG primary open angle glaucoma; EBMD epithelial/anterior basement membrane dystrophy; ACIOL anterior chamber intraocular lens; IOL intraocular lens; PCIOL posterior chamber intraocular lens;  Phaco/IOL phacoemulsification with intraocular lens placement; PRK photorefractive keratectomy; LASIK laser assisted in situ keratomileusis; HTN hypertension; DM diabetes mellitus; COPD chronic obstructive pulmonary disease

## 2020-06-29 ENCOUNTER — Ambulatory Visit (INDEPENDENT_AMBULATORY_CARE_PROVIDER_SITE_OTHER): Payer: Medicare Other | Admitting: Ophthalmology

## 2020-06-29 ENCOUNTER — Other Ambulatory Visit: Payer: Self-pay

## 2020-06-29 ENCOUNTER — Encounter (INDEPENDENT_AMBULATORY_CARE_PROVIDER_SITE_OTHER): Payer: Self-pay | Admitting: Ophthalmology

## 2020-06-29 DIAGNOSIS — H35033 Hypertensive retinopathy, bilateral: Secondary | ICD-10-CM

## 2020-06-29 DIAGNOSIS — H35373 Puckering of macula, bilateral: Secondary | ICD-10-CM | POA: Diagnosis not present

## 2020-06-29 DIAGNOSIS — I10 Essential (primary) hypertension: Secondary | ICD-10-CM | POA: Diagnosis not present

## 2020-06-29 DIAGNOSIS — H3581 Retinal edema: Secondary | ICD-10-CM | POA: Diagnosis not present

## 2020-06-29 DIAGNOSIS — Z961 Presence of intraocular lens: Secondary | ICD-10-CM

## 2020-06-29 DIAGNOSIS — H33193 Other retinoschisis and retinal cysts, bilateral: Secondary | ICD-10-CM

## 2020-07-22 ENCOUNTER — Encounter: Payer: Medicare Other | Admitting: Nurse Practitioner

## 2020-07-22 ENCOUNTER — Telehealth: Payer: Self-pay

## 2020-07-22 ENCOUNTER — Other Ambulatory Visit: Payer: Self-pay

## 2020-07-22 NOTE — Progress Notes (Signed)
4 attempts were made to contact pt without success, unable to leave a voicemail.

## 2020-07-22 NOTE — Telephone Encounter (Signed)
Made several attempts to contact patient to conduct AWV,but was unsuccessful. Patient phone went to voicemail,but mailbox was full, so unable to leave a message.  1st attempt-9:07 am 2nd attempt-9:17 am 3rd and final attempt-9:40am

## 2020-07-23 ENCOUNTER — Ambulatory Visit (INDEPENDENT_AMBULATORY_CARE_PROVIDER_SITE_OTHER): Payer: Medicare Other | Admitting: Nurse Practitioner

## 2020-07-23 ENCOUNTER — Telehealth: Payer: Self-pay

## 2020-07-23 ENCOUNTER — Other Ambulatory Visit: Payer: Self-pay

## 2020-07-23 ENCOUNTER — Encounter: Payer: Self-pay | Admitting: Nurse Practitioner

## 2020-07-23 DIAGNOSIS — Z0000 Encounter for general adult medical examination without abnormal findings: Secondary | ICD-10-CM

## 2020-07-23 NOTE — Progress Notes (Signed)
This service is provided via telemedicine  No vital signs collected/recorded due to the encounter was a telemedicine visit.   Location of patient (ex: home, work):  Home  Patient consents to a telephone visit:  Yes, see encounter dated 07/23/2020  Location of the provider (ex: office, home):  [facility]  Name of any referring provider: Einar Crow, MD  Names of all persons participating in the telemedicine service and their role in the encounter:  Abbey Chatters, Nurse Practitioner, Elveria Royals, CMA, and patient.   Time spent on call:  10 minutes with medical assistant

## 2020-07-23 NOTE — Patient Instructions (Signed)
[name] , Thank you for taking time to come for your Medicare Wellness Visit. I appreciate your ongoing commitment to your health goals. Please review the following plan we discussed and let me know if I can assist you in the future.   Screening recommendations/referrals: Colonoscopy Aged out Mammogram aged out Bone Density you have this scheduled  Recommended yearly ophthalmology/optometry visit for glaucoma screening and checkup Recommended yearly dental visit for hygiene and checkup  Vaccinations: Influenza vaccine up to date Pneumococcal vaccine  Tdap vaccine RECOMMENDED to get at local pharmacy Shingles vaccine RECOMMENDED- shringix recommended at local pharmacy    Advanced directives: on file.   Conditions/risks identified: advanced age  Next appointment: 1 year for AWV    Preventive Care 82 Years and Older, Female Preventive care refers to lifestyle choices and visits with your health care provider that can promote health and wellness. What does preventive care include?  A yearly physical exam. This is also called an annual well check.  Dental exams once or twice a year.  Routine eye exams. Ask your health care provider how often you should have your eyes checked.  Personal lifestyle choices, including:  Daily care of your teeth and gums.  Regular physical activity.  Eating a healthy diet.  Avoiding tobacco and drug use.  Limiting alcohol use.  Practicing safe sex.  Taking low-dose aspirin every day.  Taking vitamin and mineral supplements as recommended by your health care provider. What happens during an annual well check? The services and screenings done by your health care provider during your annual well check will depend on your age, overall health, lifestyle risk factors, and family history of disease. Counseling  Your health care provider may ask you questions about your:  Alcohol use.  Tobacco use.  Drug use.  Emotional  well-being.  Home and relationship well-being.  Sexual activity.  Eating habits.  History of falls.  Memory and ability to understand (cognition).  Work and work Astronomer.  Reproductive health. Screening  You may have the following tests or measurements:  Height, weight, and BMI.  Blood pressure.  Lipid and cholesterol levels. These may be checked every 5 years, or more frequently if you are over 69 years old.  Skin check.  Lung cancer screening. You may have this screening every year starting at age 33 if you have a 30-pack-year history of smoking and currently smoke or have quit within the past 15 years.  Fecal occult blood test (FOBT) of the stool. You may have this test every year starting at age 52.  Flexible sigmoidoscopy or colonoscopy. You may have a sigmoidoscopy every 5 years or a colonoscopy every 10 years starting at age 53.  Hepatitis C blood test.  Hepatitis B blood test.  Sexually transmitted disease (STD) testing.  Diabetes screening. This is done by checking your blood sugar (glucose) after you have not eaten for a while (fasting). You may have this done every 1-3 years.  Bone density scan. This is done to screen for osteoporosis. You may have this done starting at age 97.  Mammogram. This may be done every 1-2 years. Talk to your health care provider about how often you should have regular mammograms. Talk with your health care provider about your test results, treatment options, and if necessary, the need for more tests. Vaccines  Your health care provider may recommend certain vaccines, such as:  Influenza vaccine. This is recommended every year.  Tetanus, diphtheria, and acellular pertussis (Tdap, Td) vaccine. You  may need a Td booster every 10 years.  Zoster vaccine. You may need this after age 27.  Pneumococcal 13-valent conjugate (PCV13) vaccine. One dose is recommended after age 30.  Pneumococcal polysaccharide (PPSV23) vaccine. One  dose is recommended after age 65. Talk to your health care provider about which screenings and vaccines you need and how often you need them. This information is not intended to replace advice given to you by your health care provider. Make sure you discuss any questions you have with your health care provider. Document Released: 06/04/2015 Document Revised: 01/26/2016 Document Reviewed: 03/09/2015 Elsevier Interactive Patient Education  2017 [place] Prevention in the Home Falls can cause injuries. They can happen to people of all ages. There are many things you can do to make your home safe and to help prevent falls. What can I do on the outside of my home?  Regularly fix the edges of walkways and driveways and fix any cracks.  Remove anything that might make you trip as you walk through a door, such as a raised step or threshold.  Trim any bushes or trees on the path to your home.  Use bright outdoor lighting.  Clear any walking paths of anything that might make someone trip, such as rocks or tools.  Regularly check to see if handrails are loose or broken. Make sure that both sides of any steps have handrails.  Any raised decks and porches should have guardrails on the edges.  Have any leaves, snow, or ice cleared regularly.  Use sand or salt on walking paths during winter.  Clean up any spills in your garage right away. This includes oil or grease spills. What can I do in the bathroom?  Use night lights.  Install grab bars by the toilet and in the tub and shower. Do not use towel bars as grab bars.  Use non-skid mats or decals in the tub or shower.  If you need to sit down in the shower, use a plastic, non-slip stool.  Keep the floor dry. Clean up any water that spills on the floor as soon as it happens.  Remove soap buildup in the tub or shower regularly.  Attach bath mats securely with double-sided non-slip rug tape.  Do not have throw rugs and other  things on the floor that can make you trip. What can I do in the bedroom?  Use night lights.  Make sure that you have a light by your bed that is easy to reach.  Do not use any sheets or blankets that are too big for your bed. They should not hang down onto the floor.  Have a firm chair that has side arms. You can use this for support while you get dressed.  Do not have throw rugs and other things on the floor that can make you trip. What can I do in the kitchen?  Clean up any spills right away.  Avoid walking on wet floors.  Keep items that you use a lot in easy-to-reach places.  If you need to reach something above you, use a strong step stool that has a grab bar.  Keep electrical cords out of the way.  Do not use floor polish or wax that makes floors slippery. If you must use wax, use non-skid floor wax.  Do not have throw rugs and other things on the floor that can make you trip. What can I do with my stairs?  Do not leave any items  on the stairs.  Make sure that there are handrails on both sides of the stairs and use them. Fix handrails that are broken or loose. Make sure that handrails are as long as the stairways.  Check any carpeting to make sure that it is firmly attached to the stairs. Fix any carpet that is loose or worn.  Avoid having throw rugs at the top or bottom of the stairs. If you do have throw rugs, attach them to the floor with carpet tape.  Make sure that you have a light switch at the top of the stairs and the bottom of the stairs. If you do not have them, ask someone to add them for you. What else can I do to help prevent falls?  Wear shoes that:  Do not have high heels.  Have rubber bottoms.  Are comfortable and fit you well.  Are closed at the toe. Do not wear sandals.  If you use a stepladder:  Make sure that it is fully opened. Do not climb a closed stepladder.  Make sure that both sides of the stepladder are locked into place.  Ask  someone to hold it for you, if possible.  Clearly mark and make sure that you can see:  Any grab bars or handrails.  First and last steps.  Where the edge of each step is.  Use tools that help you move around (mobility aids) if they are needed. These include:  Canes.  Walkers.  Scooters.  Crutches.  Turn on the lights when you go into a dark area. Replace any light bulbs as soon as they burn out.  Set up your furniture so you have a clear path. Avoid moving your furniture around.  If any of your floors are uneven, fix them.  If there are any pets around you, be aware of where they are.  Review your medicines with your doctor. Some medicines can make you feel dizzy. This can increase your chance of falling. Ask your doctor what other things that you can do to help prevent falls. This information is not intended to replace advice given to you by your health care provider. Make sure you discuss any questions you have with your health care provider. Document Released: 03/04/2009 Document Revised: 10/14/2015 Document Reviewed: 06/12/2014 Elsevier Interactive Patient Education  2017 [employer].

## 2020-07-23 NOTE — Telephone Encounter (Signed)
Claudia Hunt, Claudia Hunt are scheduled for a virtual visit with your provider today.    Just as we do with appointments in the office, we must obtain your consent to participate.  Your consent will be active for this visit and any virtual visit you may have with one of our providers in the next 365 days.    If you have a MyChart account, I can also send a copy of this consent to you electronically.  All virtual visits are billed to your insurance company just like a traditional visit in the office.  As this is a virtual visit, video technology does not allow for your provider to perform a traditional examination.  This may limit your provider's ability to fully assess your condition.  If your provider identifies any concerns that need to be evaluated in person or the need to arrange testing such as labs, EKG, etc, we will make arrangements to do so.    Although advances in technology are sophisticated, we cannot ensure that it will always work on either your end or our end.  If the connection with a video visit is poor, we may have to switch to a telephone visit.  With either a video or telephone visit, we are not always able to ensure that we have a secure connection.   I need to obtain your verbal consent now.   Are you willing to proceed with your visit today?   Claudia Hunt has provided verbal consent on 07/23/2020 for a virtual visit (video or telephone).   Elveria Royals, CMA 07/23/2020  1:21 PM

## 2020-07-23 NOTE — Progress Notes (Signed)
Subjective:   Claudia Hunt is a 80 y.o. female who presents for Medicare Annual (Subsequent) preventive examination.  Review of Systems     Cardiac Risk Factors include: advanced age (>3055men, 47>65 women);hypertension;dyslipidemia     Objective:    There were no vitals filed for this visit. There is no height or weight on file to calculate BMI.  Advanced Directives 07/23/2020  Does Patient Have a Medical Advance Directive? Yes  Type of Advance Directive Out of facility DNR (pink MOST or yellow form)  Does patient want to make changes to medical advance directive? No - Patient declined  Pre-existing out of facility DNR order (yellow form or pink MOST form) Pink MOST form placed in chart (order not valid for inpatient use)    Current Medications (verified) Outpatient Encounter Medications as of 07/23/2020  Medication Sig  . aspirin EC 81 MG tablet Take 81 mg by mouth daily.  . calcium carbonate (OSCAL) 1500 (600 Ca) MG TABS tablet Take 600 mg of elemental calcium by mouth daily with breakfast.  . Cholecalciferol (EQL VITAMIN D3) 50 MCG (2000 UT) CAPS Take 2,000 Units by mouth daily.  Marland Kitchen. estradiol (ESTRACE) 1 MG tablet Insert 1/2 tablet vaginally three times per week.  . Glucosamine-Chondroit-Vit C-Mn (GLUCOSAMINE 1500 COMPLEX PO) Take 1,500 mg by mouth daily.  Marland Kitchen. levothyroxine (SYNTHROID) 75 MCG tablet TAKE 1/2 TABLET (37.5 MCG TOTAL) BY MOUTH DAILY BEFORE BREAKFAST.  . Multiple Vitamin (MULTIVITAMIN ADULT PO) Take by mouth.  . multivitamin-lutein (OCUVITE-LUTEIN) CAPS capsule Take 1 capsule by mouth daily.  . Omega-3 Fatty Acids (FISH OIL PO) Take 2,000 mg by mouth daily.  . polyethylene glycol (MIRALAX / GLYCOLAX) 17 g packet Take 17 g by mouth. Every other night  . [DISCONTINUED] lactulose (CHRONULAC) 10 GM/15ML solution Take 15 mLs (10 g total) by mouth at bedtime.  . [DISCONTINUED] Probiotic Product (FLORAJEN3 PO) Take 460 mg by mouth daily.   No facility-administered  encounter medications on file as of 07/23/2020.    Allergies (verified) Ciprofloxacin, Diflucan [fluconazole], and Sulfa antibiotics   History: Past Medical History:  Diagnosis Date  . Arthritis   . Chronic constipation   . CKD (chronic kidney disease)   . Gallstones   . History of kidney stones   . Interstitial cystitis   . Thyroid disease   . UTI (urinary tract infection)    Past Surgical History:  Procedure Laterality Date  . CATARACT EXTRACTION Bilateral   . CHOLECYSTECTOMY    . EYE SURGERY Bilateral    Cat Sx  . PARTIAL HYSTERECTOMY    . TUBAL LIGATION     Family History  Problem Relation Age of Onset  . Cancer Father   . Diabetes Father   . Prostate cancer Brother   . Colon cancer Paternal Uncle    Social History   Socioeconomic History  . Marital status: Widowed    Spouse name: Not on file  . Number of children: 3  . Years of education: Not on file  . Highest education level: Not on file  Occupational History  . Occupation: retired Runner, broadcasting/film/videoteacher   Tobacco Use  . Smoking status: Never Smoker  . Smokeless tobacco: Never Used  Vaping Use  . Vaping Use: Never used  Substance and Sexual Activity  . Alcohol use: Yes    Alcohol/week: 2.0 standard drinks    Types: 2 Glasses of wine per week    Comment: weekly  . Drug use: Never  . Sexual activity: Not on  file  Other Topics Concern  . Not on file  Social History Narrative  . Not on file   Social Determinants of Health   Financial Resource Strain: Not on file  Food Insecurity: Not on file  Transportation Needs: Not on file  Physical Activity: Not on file  Stress: Not on file  Social Connections: Not on file    Tobacco Counseling Counseling given: Not Answered   Clinical Intake:  Pre-visit preparation completed: Yes  Pain : No/denies pain     BMI - recorded: 25 Nutritional Status: BMI 25 -29 Overweight Nutritional Risks: None Diabetes: No  How often do you need to have someone help you when  you read instructions, pamphlets, or other written materials from your doctor or pharmacy?: 1 - Never  Diabetic?no         Activities of Daily Living In your present state of health, do you have any difficulty performing the following activities: 07/23/2020  Hearing? N  Vision? N  Difficulty concentrating or making decisions? N  Walking or climbing stairs? N  Dressing or bathing? N  Doing errands, shopping? N  Preparing Food and eating ? N  Using the Toilet? N  In the past six months, have you accidently leaked urine? Y  Do you have problems with loss of bowel control? N  Managing your Medications? N  Managing your Finances? N  Comment son helps  Housekeeping or managing your Housekeeping? N  Some recent data might be hidden    Patient Care Team: Mahlon Gammon, MD as PCP - General (Internal Medicine)  Indicate any recent Medical Services you may have received from other than [facility] providers in the past year (date may be approximate).     Assessment:   This is a routine wellness examination for Claudia Hunt.  Hearing/Vision screen  Hearing Screening   125Hz  250Hz  500Hz  1000Hz  2000Hz  3000Hz  4000Hz  6000Hz  8000Hz   Right ear:           Left ear:           Comments: Patient has no hearing problems  Vision Screening Comments: Patient has no vision problems. Patient wears reading glasses. Patient had eye exam Feb. 2022.   Dietary issues and exercise activities discussed: Current Exercise Habits: Home exercise routine, Type of exercise: walking, Time (Minutes): 60, Frequency (Times/Week): 6, Weekly Exercise (Minutes/Week): 360  Goals    . Patient Stated     To continue to be active through walking and stretching       Depression Screen PHQ 2/9 Scores 07/23/2020  PHQ - 2 Score 0    Fall Risk Fall Risk  07/23/2020 01/23/2020 09/25/2019 05/29/2019 03/20/2019  Falls in the past year? 0 0 0 0 0  Number falls in past yr: 0 0 0 0 0  Injury with Fall? 0 - - - -    FALL RISK  PREVENTION PERTAINING TO THE HOME:  Any stairs in or around the home? Yes  If so, are there any without handrails? No  Home free of loose throw rugs in walkways, pet beds, electrical cords, etc? Yes  Adequate lighting in your home to reduce risk of falls? Yes   ASSISTIVE DEVICES UTILIZED TO PREVENT FALLS:  Life alert? No  Use of a cane, walker or w/c? No  Grab bars in the bathroom? Yes  Shower chair or bench in shower? No  Elevated toilet seat or a handicapped toilet? No   TIMED UP AND GO:  Was the test performed? No .  Cognitive Function:     6CIT Screen 07/23/2020  What Year? 0 points  What month? 0 points  What time? 0 points  Count back from 20 0 points  Months in reverse 0 points  Repeat phrase 0 points  Total Score 0    Immunizations Immunization History  Administered Date(s) Administered  . Influenza, High Dose Seasonal PF 02/20/2019, 03/03/2020  . Moderna Sars-Covid-2 Vaccination 05/26/2019, 06/23/2019    TDAP status: Due, Education has been provided regarding the importance of this vaccine. Advised may receive this vaccine at local pharmacy or Health Dept. Aware to provide a copy of the vaccination record if obtained from local pharmacy or Health Dept. Verbalized acceptance and understanding.  Flu Vaccine status: Up to date  Pneumococcal vaccine status: Up to date  Covid-19 vaccine status: Completed vaccines  Qualifies for Shingles Vaccine? Yes   Zostavax completed No   Shingrix Completed?: No.    Education has been provided regarding the importance of this vaccine. Patient has been advised to call insurance company to determine out of pocket expense if they have not yet received this vaccine. Advised may also receive vaccine at local pharmacy or Health Dept. Verbalized acceptance and understanding.  Screening Tests Health Maintenance  Topic Date Due  . TETANUS/TDAP  Never done  . PNA vac Low Risk Adult (1 of 2 - PCV13) Never done  . COVID-19 Vaccine (3  - Booster for Moderna series) 12/21/2019  . INFLUENZA VACCINE  Completed  . DEXA SCAN  Completed  . HPV VACCINES  Aged Out    Health Maintenance  Health Maintenance Due  Topic Date Due  . TETANUS/TDAP  Never done  . PNA vac Low Risk Adult (1 of 2 - PCV13) Never done  . COVID-19 Vaccine (3 - Booster for Moderna series) 12/21/2019    Colorectal cancer screening: No longer required.   Mammogram status: No longer required due to age.  Bone Density status: Ordered and scheduled . Pt provided with contact info and advised to call to schedule appt.  Lung Cancer Screening: (Low Dose CT Chest recommended if Age 71-80 years, 30 pack-year currently smoking OR have quit w/in 15years.) does not qualify.    Additional Screening:  Hepatitis C Screening: does not qualify;  Vision Screening: Recommended annual ophthalmology exams for early detection of glaucoma and other disorders of the eye. Is the patient up to date with their annual eye exam?  Yes  Who is the provider or what is the name of the office in which the patient attends annual eye exams? Dr Florentina Jenny  If pt is not established with a provider, would they like to be referred to a provider to establish care? No .   Dental Screening: Recommended annual dental exams for proper oral hygiene  Community Resource Referral / Chronic Care Management: CRR required this visit?  No   CCM required this visit?  No      Plan:     I have personally reviewed and noted the following in the patient's chart:   . Medical and social history . Use of alcohol, tobacco or illicit drugs  . Current medications and supplements . Functional ability and status . Nutritional status . Physical activity . Advanced directives . List of other physicians . Hospitalizations, surgeries, and ER visits in previous 12 months . Vitals . Screenings to include cognitive, depression, and falls . Referrals and appointments  In addition, I have reviewed and  discussed with patient certain preventive protocols, quality metrics,  and best practice recommendations. A written personalized care plan for preventive services as well as general preventive health recommendations were provided to patient.     Sharon Seller, NP   07/23/2020    Virtual Visit via Telephone Note  I connected with@ on 07/23/20 at  1:00 PM EST by telephone and verified that I am speaking with the correct person using two identifiers.  Location: Patient: home Provider: office   I discussed the limitations, risks, security and privacy concerns of performing an evaluation and management service by telephone and the availability of in person appointments. I also discussed with the patient that there may be a patient responsible charge related to this service. The patient expressed understanding and agreed to proceed.   I discussed the assessment and treatment plan with the patient. The patient was provided an opportunity to ask questions and all were answered. The patient agreed with the plan and demonstrated an understanding of the instructions.   The patient was advised to call back or seek an in-person evaluation if the symptoms worsen or if the condition fails to improve as anticipated.  I provided 19 minutes of non-face-to-face time during this encounter.  Janene Harvey. [employer] printed and mailed

## 2020-07-29 ENCOUNTER — Encounter: Payer: Medicare Other | Admitting: Nurse Practitioner

## 2020-08-12 ENCOUNTER — Other Ambulatory Visit: Payer: Self-pay

## 2020-08-12 ENCOUNTER — Encounter: Payer: Self-pay | Admitting: Nurse Practitioner

## 2020-08-12 ENCOUNTER — Other Ambulatory Visit: Payer: Self-pay | Admitting: Internal Medicine

## 2020-08-12 ENCOUNTER — Non-Acute Institutional Stay: Payer: Medicare Other | Admitting: Nurse Practitioner

## 2020-08-12 DIAGNOSIS — E039 Hypothyroidism, unspecified: Secondary | ICD-10-CM

## 2020-08-12 DIAGNOSIS — E785 Hyperlipidemia, unspecified: Secondary | ICD-10-CM | POA: Diagnosis not present

## 2020-08-12 DIAGNOSIS — K219 Gastro-esophageal reflux disease without esophagitis: Secondary | ICD-10-CM

## 2020-08-12 DIAGNOSIS — N952 Postmenopausal atrophic vaginitis: Secondary | ICD-10-CM

## 2020-08-12 DIAGNOSIS — K5901 Slow transit constipation: Secondary | ICD-10-CM | POA: Diagnosis not present

## 2020-08-12 MED ORDER — LEVOTHYROXINE SODIUM 75 MCG PO TABS: ORAL_TABLET | ORAL | 1 refills | Status: DC

## 2020-08-12 MED ORDER — ESTRADIOL 1 MG PO TABS: ORAL_TABLET | ORAL | 3 refills | Status: DC

## 2020-08-12 NOTE — Assessment & Plan Note (Signed)
Hypothyroidism, on Levothyroxine qd, TSH 3.34 04/21/20

## 2020-08-12 NOTE — Assessment & Plan Note (Signed)
Postmenopausal symptoms, managed with Estradiol 0.25mg  qd.

## 2020-08-12 NOTE — Progress Notes (Signed)
Location:   clinic [facility]   Place of Service:  Clinic (12) Provider: Chipper Oman NP  Code Status: DNR Goals of Care: [place] 07/23/2020  Does Patient Have a Medical Advance Directive? Yes  Type of Advance Directive Out of facility DNR (pink MOST or yellow form)  Does patient want to make changes to medical advance directive? No - Patient declined  Pre-existing out of facility DNR order (yellow form or pink MOST form) Pink MOST form placed in chart (order not valid for inpatient use)     Chief Complaint  Patient presents with  . Medical Management of Chronic Issues    Patient returns to the clinic for her 6 month follow up. CC: Indigestion. Refills on Levothyroxine and estrogen.     HPI: Patient is a 80 y.o. female seen today for medical management of chronic diseases.    GERD, stable, on Famotidine 20mg  bid. Hgb 13.5 04/21/20   Postmenopausal symptoms, managed with Estradiol 0.25mg  qd.    Hypothyroidism, on Levothyroxine 14/1/21 qd, TSH 3.34 04/21/20   Hyperlipidemia, takes Fish oil, ASA 81mg  qd, LDL 107 04/21/20  Constipation, MiraLax qod, some how floated. Linzess is too strong.    Past Medical History:  Diagnosis Date  . Arthritis   . Chronic constipation   . CKD (chronic kidney disease)   . Gallstones   . History of kidney stones   . Interstitial cystitis   . Thyroid disease   . UTI (urinary tract infection)     Past Surgical History:  Procedure Laterality Date  . CATARACT EXTRACTION Bilateral   . CHOLECYSTECTOMY    . EYE SURGERY Bilateral    Cat Sx  . PARTIAL HYSTERECTOMY    . TUBAL LIGATION      Allergies  Allergen Reactions  . Ciprofloxacin   . Diflucan [Fluconazole]   . Sulfa Antibiotics     Allergies as of 08/12/2020      Reactions   Ciprofloxacin    Diflucan [fluconazole]    Sulfa Antibiotics       Medication List       Accurate as of August 12, 2020 11:59 PM. If you have any questions, ask your nurse or doctor.        aspirin  EC 81 MG tablet Take 81 mg by mouth daily.   calcium carbonate 1500 (600 Ca) MG Tabs tablet Commonly known as: OSCAL Take 600 mg of elemental calcium by mouth daily with breakfast.   EQL Vitamin D3 50 MCG (2000 UT) Caps Generic drug: Cholecalciferol Take 2,000 Units by mouth daily.   estradiol 1 MG tablet Commonly known as: Estrace Insert 1/2 tablet vaginally three times per week.   FISH OIL PO Take 2,000 mg by mouth daily.   GLUCOSAMINE 1500 COMPLEX PO Take 1,500 mg by mouth daily.   levothyroxine 75 MCG tablet Commonly known as: SYNTHROID TAKE 1/2 TABLET (37.5 MCG TOTAL) BY MOUTH DAILY BEFORE BREAKFAST.   MULTIVITAMIN ADULT PO Take by mouth.   multivitamin-lutein Caps capsule Take 1 capsule by mouth daily.   polyethylene glycol 17 g packet Commonly known as: MIRALAX / GLYCOLAX Take 17 g by mouth. Every other night       Review of Systems:  Review of Systems  Constitutional: Negative for fatigue, fever and unexpected weight change.  HENT: Positive for hearing loss. Negative for congestion and voice change.   Eyes: Negative for visual disturbance.  Respiratory: Negative for cough and shortness of breath.   Cardiovascular: Negative for  leg swelling.  Gastrointestinal: Negative for abdominal pain, constipation, nausea and vomiting.       Bloated after Laxatives.   Genitourinary: Negative for difficulty urinating, dysuria and urgency.       Urination x1/night.   Musculoskeletal: Negative for gait problem.  Skin: Negative for color change.  Neurological: Negative for speech difficulty, weakness and light-headedness.  Psychiatric/Behavioral: Negative for behavioral problems and sleep disturbance. The patient is not nervous/anxious.        5 hours average sleep at night.     Health Maintenance  Topic Date Due  . TETANUS/TDAP  Never done  . PNA vac Low Risk Adult (1 of 2 - PCV13) Never done  . COVID-19 Vaccine (3 - Booster for Moderna series) 12/21/2019  .  INFLUENZA VACCINE  Completed  . DEXA SCAN  Completed  . HPV VACCINES  Aged Out    Physical Exam: Vitals:   08/12/20 1516  BP: 120/76  Pulse: 75  Temp: (!) 97.1 F (36.2 C)  SpO2: 97%  Weight: 141 lb 9.6 oz (64.2 kg)  Height: 5\' 2"  (1.575 m)   Body mass index is 25.9 kg/m. Physical Exam Vitals and nursing note reviewed.  Constitutional:      Appearance: Normal appearance.     Comments: Over weight.   HENT:     Head: Normocephalic and atraumatic.     Mouth/Throat:     Mouth: Mucous membranes are moist.  Eyes:     Extraocular Movements: Extraocular movements intact.     Conjunctiva/sclera: Conjunctivae normal.     Pupils: Pupils are equal, round, and reactive to light.  Cardiovascular:     Rate and Rhythm: Normal rate and regular rhythm.     Heart sounds: No murmur heard.   Pulmonary:     Effort: Pulmonary effort is normal.     Breath sounds: No rales.  Abdominal:     General: Bowel sounds are normal.     Palpations: Abdomen is soft.     Tenderness: There is no abdominal tenderness.     Comments: Shifting dullness on percussion.   Musculoskeletal:     Cervical back: Normal range of motion and neck supple.     Right lower leg: No edema.     Left lower leg: No edema.  Skin:    General: Skin is warm and dry.  Neurological:     General: No focal deficit present.     Mental Status: She is alert and oriented to person, place, and time. Mental status is at baseline.     Gait: Gait normal.  Psychiatric:        Mood and Affect: Mood normal.        Behavior: Behavior normal.        Thought Content: Thought content normal.        Judgment: Judgment normal.     Labs reviewed: Basic Metabolic Panel: Recent Labs    01/19/20 0838 04/21/20 1344  NA  --  140  K  --  4.7  CL  --  104  CO2  --  23  GLUCOSE  --  94  BUN  --  10  CREATININE  --  0.74  CALCIUM  --  9.5  TSH 4.80* 3.34   Liver Function Tests: Recent Labs    04/21/20 1344  AST 24  ALT 22   BILITOT 0.4  PROT 6.7   No results for input(s): LIPASE, AMYLASE in the last 8760 hours. No results for input(s):  AMMONIA in the last 8760 hours. CBC: Recent Labs    04/21/20 1344  WBC 4.7  NEUTROABS 2,773  HGB 13.5  HCT 39.7  MCV 92.5  PLT 262   Lipid Panel: Recent Labs    04/21/20 1344  CHOL 193  HDL 69  LDLCALC 107*  TRIG 79  CHOLHDL 2.8   No results found for: HGBA1C  Procedures since last visit: No results found.  Assessment/Plan  Hyperlipidemia Hyperlipidemia, takes Fish oil, ASA 81mg  qd, LDL 107 04/21/20  Hypothyroidism Hypothyroidism, on Levothyroxine 14/1/21 qd, TSH 3.34 04/21/20    Slow transit constipation Stable, takes MiraLax qod   GERD (gastroesophageal reflux disease) stable, on Famotidine 20mg  bid. Hgb 13.5 04/21/20   Atrophic vaginitis Postmenopausal symptoms, managed with Estradiol 0.25mg  qd.     Labs/tests ordered:  None  Next appt:  4 months.

## 2020-08-12 NOTE — Assessment & Plan Note (Signed)
stable, on Famotidine 20mg  bid. Hgb 13.5 04/21/20

## 2020-08-12 NOTE — Assessment & Plan Note (Signed)
Stable, takes MiraLax qod.  

## 2020-08-12 NOTE — Assessment & Plan Note (Signed)
Hyperlipidemia, takes Fish oil, ASA 81mg  qd, LDL 107 04/21/20

## 2020-08-13 ENCOUNTER — Encounter: Payer: Self-pay | Admitting: Nurse Practitioner

## 2020-08-27 ENCOUNTER — Other Ambulatory Visit: Payer: Medicare Other

## 2020-09-09 ENCOUNTER — Other Ambulatory Visit: Payer: Self-pay | Admitting: Internal Medicine

## 2020-09-09 DIAGNOSIS — E039 Hypothyroidism, unspecified: Secondary | ICD-10-CM

## 2020-09-13 ENCOUNTER — Encounter: Payer: Self-pay | Admitting: Family

## 2020-09-13 ENCOUNTER — Other Ambulatory Visit: Payer: Self-pay

## 2020-09-13 ENCOUNTER — Ambulatory Visit (INDEPENDENT_AMBULATORY_CARE_PROVIDER_SITE_OTHER): Payer: Medicare Other | Admitting: Family

## 2020-09-13 VITALS — BP 110/60 | HR 87 | Temp 98.2°F | Resp 16 | Ht 62.0 in | Wt 140.0 lb

## 2020-09-13 DIAGNOSIS — H6123 Impacted cerumen, bilateral: Secondary | ICD-10-CM

## 2020-09-13 MED ORDER — DEBROX 6.5 % OT SOLN: 5.0000 [drp] | Freq: Two times a day (BID) | OTIC | 0 refills | Status: AC

## 2020-09-13 NOTE — Progress Notes (Signed)
Provider: Richarda Blade FNP-C  Mast, Man X, NP  Patient Care Team: Mast, Man X, NP as PCP - General (Internal Medicine)  Extended Emergency Contact Information Primary Emergency Contact: Milbert Coulter Address: [address]          [place], [place] [address] Darden Amber of [place] Home Phone: [phone] Mobile Phone: [phone] Relation: Son Secondary Emergency Contact: Desma Paganini Address: [address]          [place], [place] [address] Darden Amber of [place] Phone: [phone] Relation: Son  Code Status:  DNR Goals of care: Advanced Directive information Advanced Directives 09/13/2020  Does Patient Have a Medical Advance Directive? Yes  Type of Advance Directive Out of facility DNR (pink MOST or yellow form)  Does patient want to make changes to medical advance directive? No - Patient declined  Pre-existing out of facility DNR order (yellow form or pink MOST form) Pink MOST form placed in chart (order not valid for inpatient use)     Chief Complaint  Patient presents with  . Acute Visit    Patient complains of right ear being stopped up for several weeks.    HPI:  Pt is a 80 y.o. female seen today for an acute visit for evaluation of right ear being stopped up for 3 weeks.Has used drops then tried to flush them by herself but didn't get anything.Has had decreased hearing on right ear.  Had cough several weeks prior to ears feeling stuffed up.    Past Medical History:  Diagnosis Date  . Arthritis   . Chronic constipation   . CKD (chronic kidney disease)   . Gallstones   . History of kidney stones   . Interstitial cystitis   . Thyroid disease   . UTI (urinary tract infection)    Past Surgical History:  Procedure Laterality Date  . CATARACT EXTRACTION Bilateral   . CHOLECYSTECTOMY    . EYE SURGERY Bilateral    Cat Sx  . PARTIAL HYSTERECTOMY    . TUBAL LIGATION      Allergies  Allergen Reactions  . Ciprofloxacin   . Diflucan [Fluconazole]    . Sulfa Antibiotics     Outpatient Encounter Medications as of 09/13/2020  Medication Sig  . aspirin EC 81 MG tablet Take 81 mg by mouth daily.  . calcium carbonate (OSCAL) 1500 (600 Ca) MG TABS tablet Take 600 mg of elemental calcium by mouth daily with breakfast.  . Cholecalciferol (EQL VITAMIN D3) 50 MCG (2000 UT) CAPS Take 2,000 Units by mouth daily.  Marland Kitchen estradiol (ESTRACE) 1 MG tablet Insert 1/2 tablet vaginally three times per week.  . Glucosamine-Chondroit-Vit C-Mn (GLUCOSAMINE 1500 COMPLEX PO) Take 1,500 mg by mouth daily.  Marland Kitchen levothyroxine (SYNTHROID) 75 MCG tablet TAKE 1/2 TABLET (37.5 MCG TOTAL) BY MOUTH DAILY BEFORE BREAKFAST.  . Multiple Vitamin (MULTIVITAMIN ADULT PO) Take 20 mg by mouth.  . multivitamin-lutein (OCUVITE-LUTEIN) CAPS capsule Take 1 capsule by mouth daily.  . Omega-3 Fatty Acids (FISH OIL PO) Take 2,000 mg by mouth daily.  . polyethylene glycol (MIRALAX / GLYCOLAX) 17 g packet Take 17 g by mouth. Every other night   No facility-administered encounter medications on file as of 09/13/2020.    Review of Systems  Constitutional: Negative for chills, fatigue and fever.  HENT: Negative for congestion, ear discharge, ear pain, postnasal drip, rhinorrhea, sinus pressure, sinus pain, sneezing, sore throat and tinnitus.        Decreased hearing   Eyes: Negative for discharge, redness  and itching.  Respiratory: Negative for cough, chest tightness, shortness of breath and wheezing.   Skin: Negative for color change, pallor and rash.  Neurological: Negative for dizziness, speech difficulty, weakness, light-headedness and headaches.    Immunization History  Administered Date(s) Administered  . Influenza, High Dose Seasonal PF 02/20/2019, 03/03/2020  . Moderna Sars-Covid-2 Vaccination 05/26/2019, 06/23/2019   Pertinent  Health Maintenance Due  Topic Date Due  . PNA vac Low Risk Adult (1 of 2 - PCV13) Never done  . INFLUENZA VACCINE  12/20/2020  . DEXA SCAN   Completed   Fall Risk  09/13/2020 08/12/2020 07/23/2020 01/23/2020 09/25/2019  Falls in the past year? 0 0 0 0 0  Number falls in past yr: 0 0 0 0 0  Injury with Fall? 0 - 0 - -   Functional Status Survey:    Vitals:   09/13/20 1133  BP: 110/60  Pulse: 87  Resp: 16  Temp: 98.2 F (36.8 C)  SpO2: 97%  Weight: 140 lb (63.5 kg)  Height: 5\' 2"  (1.575 m)   Body mass index is 25.61 kg/m. Physical Exam Vitals reviewed.  Constitutional:      Appearance: She is overweight.  HENT:     Head: Normocephalic.     Right Ear: There is impacted cerumen.     Left Ear: There is impacted cerumen.     Ears:     Comments: Bilateral ear cerumen lavaged with warm water and hydrogen peroxide moderate amounts of cerumen obtained on the right ear.left ear remains impacted unable to obtain cerumen.Tolerated procedure well.No instrument used.TM clear without any signs of infection on the right ear.      Nose: Nose normal. No congestion or rhinorrhea.     Mouth/Throat:     Mouth: Mucous membranes are moist.     Pharynx: Oropharynx is clear. No oropharyngeal exudate or posterior oropharyngeal erythema.  Eyes:     General: No scleral icterus.       Right eye: No discharge.        Left eye: No discharge.     Conjunctiva/sclera: Conjunctivae normal.     Pupils: Pupils are equal, round, and reactive to light.  Neck:     Vascular: No carotid bruit.  Cardiovascular:     Rate and Rhythm: Normal rate and regular rhythm.     Pulses: Normal pulses.     Heart sounds: Normal heart sounds. No murmur heard. No friction rub. No gallop.   Pulmonary:     Effort: Pulmonary effort is normal. No respiratory distress.     Breath sounds: Normal breath sounds. No wheezing, rhonchi or rales.  Chest:     Chest wall: No tenderness.  Musculoskeletal:     Cervical back: Normal range of motion. No rigidity or tenderness.  Lymphadenopathy:     Cervical: No cervical adenopathy.  Skin:    General: Skin is warm and dry.      Coloration: Skin is not pale.     Findings: No erythema.  Neurological:     Mental Status: She is alert.  Psychiatric:        Mood and Affect: Mood normal.        Behavior: Behavior normal.        Thought Content: Thought content normal.        Judgment: Judgment normal.     Labs reviewed: Recent Labs    04/21/20 1344  NA 140  K 4.7  CL 104  CO2 23  GLUCOSE  94  BUN 10  CREATININE 0.74  CALCIUM 9.5   Recent Labs    04/21/20 1344  AST 24  ALT 22  BILITOT 0.4  PROT 6.7   Recent Labs    04/21/20 1344  WBC 4.7  NEUTROABS 2,773  HGB 13.5  HCT 39.7  MCV 92.5  PLT 262   Lab Results  Component Value Date   TSH 3.34 04/21/2020   No results found for: HGBA1C Lab Results  Component Value Date   CHOL 193 04/21/2020   HDL 69 04/21/2020   LDLCALC 107 (H) 04/21/2020   TRIG 79 04/21/2020   CHOLHDL 2.8 04/21/2020    Significant Diagnostic Results in last 30 days:  No results found.  Assessment/Plan   Bilateral impacted cerumen Bilateral ear cerumen lavaged with warm water and hydrogen peroxide moderate amounts of cerumen obtained on the right ear.left ear remains impacted unable to remove cerumen.Tolerated procedure well.No instrument used.TM clear without any signs of infection on the right ear.  - will have her instil debrox drops as below to left ear then will follow up in 4 days for left ear lavage.  - carbamide peroxide (DEBROX) 6.5 % OTIC solution; Place 5 drops into the left ear 2 (two) times daily for 4 days.  Dispense: 15 mL; Refill: 0  Family/ staff Communication: Reviewed plan of care with patient verbalized understanding.   Labs/tests ordered: None   Next Appointment: 4 days here or with Maxie NP at the facility for left ear lavage.    Caesar Bookman, NP

## 2020-09-13 NOTE — Patient Instructions (Signed)
Instil debrox 6.5 % otic solution to left ear twice daily x 4 days then follow up for ear lavage,

## 2020-09-30 ENCOUNTER — Other Ambulatory Visit: Payer: Self-pay

## 2020-09-30 ENCOUNTER — Non-Acute Institutional Stay: Payer: Medicare Other | Admitting: Nurse Practitioner

## 2020-09-30 ENCOUNTER — Encounter: Payer: Self-pay | Admitting: Nurse Practitioner

## 2020-09-30 DIAGNOSIS — E559 Vitamin D deficiency, unspecified: Secondary | ICD-10-CM

## 2020-09-30 DIAGNOSIS — K219 Gastro-esophageal reflux disease without esophagitis: Secondary | ICD-10-CM

## 2020-09-30 DIAGNOSIS — E785 Hyperlipidemia, unspecified: Secondary | ICD-10-CM | POA: Diagnosis not present

## 2020-09-30 DIAGNOSIS — K5901 Slow transit constipation: Secondary | ICD-10-CM

## 2020-09-30 DIAGNOSIS — H6122 Impacted cerumen, left ear: Secondary | ICD-10-CM

## 2020-09-30 DIAGNOSIS — N952 Postmenopausal atrophic vaginitis: Secondary | ICD-10-CM

## 2020-09-30 DIAGNOSIS — E039 Hypothyroidism, unspecified: Secondary | ICD-10-CM

## 2020-09-30 NOTE — Progress Notes (Signed)
Location:   clinic [place]   Place of Service:  Clinic (12) Provider: Marlana Latus NP  Code Status: DNR Goals of Care: [place] 09/30/2020  Does Patient Have a Medical Advance Directive? Yes  Type of Advance Directive Out of facility DNR (pink MOST or yellow form);Healthcare Power of Attorney  Does patient want to make changes to medical advance directive? No - Patient declined  Copy of [place] in Chart? Yes - validated most recent copy scanned in chart (See row information)  Pre-existing out of facility DNR order (yellow form or pink MOST form) Pink MOST form placed in chart (order not valid for inpatient use)     Chief Complaint  Patient presents with  . Acute Visit    Patients presents for an ear lavage of the left ear, patient reports no pain.     HPI: Patient is a 80 y.o. female seen today for medical management of chronic diseases.    Impacted cerumen left ear    GERD, stable, off Famotidine 61m bid. Hgb 13.5 04/21/20             Postmenopausal symptoms, managed with Estradiol 0.246mqd.                 Hypothyroidism,on Levothyroxine7530mqd, TSH 3.34 04/21/20             Hyperlipidemia, takes Fish oil, ASA 74m28m, LDL 107 04/21/20             Constipation, MiraLax qod, some how floated. Linzess is too strong.   Vitamin D deficiency, takes D   Past Medical History:  Diagnosis Date  . Arthritis   . Chronic constipation   . CKD (chronic kidney disease)   . Gallstones   . History of kidney stones   . Interstitial cystitis   . Thyroid disease   . UTI (urinary tract infection)     Past Surgical History:  Procedure Laterality Date  . CATARACT EXTRACTION Bilateral   . CHOLECYSTECTOMY    . EYE SURGERY Bilateral    Cat Sx  . PARTIAL HYSTERECTOMY    . TUBAL LIGATION      Allergies  Allergen Reactions  . Ciprofloxacin   . Diflucan [Fluconazole]   . Sulfa Antibiotics     Allergies as of 09/30/2020      Reactions    Ciprofloxacin    Diflucan [fluconazole]    Sulfa Antibiotics       Medication List       Accurate as of Sep 30, 2020 11:59 PM. If you have any questions, ask your nurse or doctor.        aspirin EC 81 MG tablet Take 81 mg by mouth daily.   calcium carbonate 1500 (600 Ca) MG Tabs tablet Commonly known as: OSCAL Take 600 mg of elemental calcium by mouth daily with breakfast.   EQL Vitamin D3 50 MCG (2000 UT) Caps Generic drug: Cholecalciferol Take 2,000 Units by mouth daily.   estradiol 1 MG tablet Commonly known as: Estrace Insert 1/2 tablet vaginally three times per week.   FISH OIL PO Take 2,000 mg by mouth daily.   GLUCOSAMINE 1500 COMPLEX PO Take 1,500 mg by mouth daily.   levothyroxine 75 MCG tablet Commonly known as: SYNTHROID TAKE 1/2 TABLET (37.5 MCG TOTAL) BY MOUTH DAILY BEFORE BREAKFAST.   MULTIVITAMIN ADULT PO Take 20 mg by mouth.   multivitamin-lutein Caps capsule Take 1 capsule by mouth daily.   polyethylene  glycol 17 g packet Commonly known as: MIRALAX / GLYCOLAX Take 17 g by mouth. Every other night       Review of Systems:  Review of Systems  Constitutional: Negative for activity change, appetite change and fever.  HENT: Positive for hearing loss. Negative for congestion and voice change.   Eyes: Negative for visual disturbance.  Respiratory: Negative for cough and shortness of breath.   Cardiovascular: Negative for leg swelling.  Gastrointestinal: Negative for abdominal pain, constipation, nausea and vomiting.       Bloated after Laxatives.   Genitourinary: Negative for difficulty urinating, dysuria and urgency.       Urination x1/night.   Musculoskeletal: Negative for gait problem.  Skin: Negative for color change.  Neurological: Negative for speech difficulty, weakness and light-headedness.  Psychiatric/Behavioral: Negative for behavioral problems and sleep disturbance. The patient is not nervous/anxious.        5 hours average sleep  at night.     Health Maintenance  Topic Date Due  . TETANUS/TDAP  Never done  . PNA vac Low Risk Adult (1 of 2 - PCV13) Never done  . INFLUENZA VACCINE  12/20/2020  . DEXA SCAN  Completed  . COVID-19 Vaccine  Completed  . HPV VACCINES  Aged Out    Physical Exam: Vitals:   09/30/20 1414  BP: 128/70  Pulse: 81  Resp: 18  Temp: (!) 97.1 F (36.2 C)  TempSrc: Temporal  SpO2: 96%  Weight: 140 lb 12.8 oz (63.9 kg)  Height: _0  (1.575 m)   Body mass index is 25.75 kg/m. Physical Exam Vitals and nursing note reviewed.  Constitutional:      Appearance: Normal appearance.     Comments: Over weight.   HENT:     Head: Normocephalic and atraumatic.     Right Ear: Tympanic membrane and external ear normal.     Left Ear: External ear normal. There is impacted cerumen.     Ears:     Comments: Ear wax removed with ear lavage.     Mouth/Throat:     Mouth: Mucous membranes are moist.  Eyes:     Extraocular Movements: Extraocular movements intact.     Conjunctiva/sclera: Conjunctivae normal.     Pupils: Pupils are equal, round, and reactive to light.  Cardiovascular:     Rate and Rhythm: Normal rate and regular rhythm.     Heart sounds: No murmur heard.   Pulmonary:     Effort: Pulmonary effort is normal.     Breath sounds: No rales.  Abdominal:     Palpations: Abdomen is soft.     Tenderness: There is no abdominal tenderness.     Comments: Shifting dullness on percussion.   Musculoskeletal:     Cervical back: Normal range of motion and neck supple.     Right lower leg: No edema.     Left lower leg: No edema.  Skin:    General: Skin is warm and dry.  Neurological:     General: No focal deficit present.     Mental Status: She is alert and oriented to person, place, and time. Mental status is at baseline.     Gait: Gait normal.  Psychiatric:        Mood and Affect: Mood normal.        Behavior: Behavior normal.        Thought Content: Thought content normal.         Judgment: Judgment normal.  Labs reviewed: Basic Metabolic Panel: Recent Labs    01/19/20 0838 04/21/20 1344  NA  --  140  K  --  4.7  CL  --  104  CO2  --  23  GLUCOSE  --  94  BUN  --  10  CREATININE  --  0.74  CALCIUM  --  9.5  TSH 4.80* 3.34   Liver Function Tests: Recent Labs    04/21/20 1344  AST 24  ALT 22  BILITOT 0.4  PROT 6.7   No results for input(s): LIPASE, AMYLASE in the last 8760 hours. No results for input(s): AMMONIA in the last 8760 hours. CBC: Recent Labs    04/21/20 1344  WBC 4.7  NEUTROABS 2,773  HGB 13.5  HCT 39.7  MCV 92.5  PLT 262   Lipid Panel: Recent Labs    04/21/20 1344  CHOL 193  HDL 69  LDLCALC 107*  TRIG 79  CHOLHDL 2.8   No results found for: HGBA1C  Procedures since last visit: No results found.  Assessment/Plan  Hypothyroidism on Levothyroxine11mg qd, TSH 3.34 04/21/20, update TSH, CMP/eGFR  prior to the next appointment.    Hyperlipidemia takes Fish oil, ASA 885mqd, LDL 107 04/21/20   Slow transit constipation MiraLax qod, some how floated. Linzess is too strong.   Atrophic vaginitis managed with Estradiol 0.2514md.       GERD (gastroesophageal reflux disease) stable, off Famotidine 82m54md. Hgb 13.5 04/21/20. Repeat CBC/diff   Vitamin D deficiency Takes Vit D, repeat Vit D level.   Impacted cerumen, left ear Removed with ear lavage, tolerated well.    Labs/tests ordered: CBC/diff, CMP/eGFR, TSH, Lipid panel, Vit D Next appt:  12/02/2020

## 2020-09-30 NOTE — Assessment & Plan Note (Addendum)
on Levothyroxine52mg qd, TSH 3.34 04/21/20, update TSH, CMP/eGFR  prior to the next appointment.

## 2020-09-30 NOTE — Assessment & Plan Note (Signed)
Takes Vit D, repeat Vit D level.

## 2020-09-30 NOTE — Assessment & Plan Note (Addendum)
stable, off Famotidine 20mg  bid. Hgb 13.5 04/21/20. Repeat CBC/diff

## 2020-09-30 NOTE — Assessment & Plan Note (Signed)
managed with Estradiol 0.25mg qd.     

## 2020-09-30 NOTE — Assessment & Plan Note (Signed)
Removed with ear lavage, tolerated well.

## 2020-09-30 NOTE — Assessment & Plan Note (Signed)
takes Fish oil, ASA 81mg  qd, LDL 107 04/21/20

## 2020-09-30 NOTE — Assessment & Plan Note (Signed)
MiraLax qod, some how floated. Linzess is too strong.  

## 2020-10-01 ENCOUNTER — Encounter: Payer: Self-pay | Admitting: Nurse Practitioner

## 2020-10-11 ENCOUNTER — Other Ambulatory Visit: Payer: Self-pay | Admitting: Internal Medicine

## 2020-10-11 DIAGNOSIS — E039 Hypothyroidism, unspecified: Secondary | ICD-10-CM

## 2020-11-02 ENCOUNTER — Other Ambulatory Visit: Payer: Self-pay

## 2020-11-02 ENCOUNTER — Other Ambulatory Visit: Payer: Medicare Other

## 2020-11-02 DIAGNOSIS — E559 Vitamin D deficiency, unspecified: Secondary | ICD-10-CM

## 2020-11-02 DIAGNOSIS — E039 Hypothyroidism, unspecified: Secondary | ICD-10-CM

## 2020-11-02 DIAGNOSIS — K219 Gastro-esophageal reflux disease without esophagitis: Secondary | ICD-10-CM

## 2020-11-02 DIAGNOSIS — E785 Hyperlipidemia, unspecified: Secondary | ICD-10-CM

## 2020-11-05 LAB — LIPID PANEL
Cholesterol: 183 mg/dL (ref <200)
HDL: 63 mg/dL (ref >=50)
LDL Cholesterol (Calc): 100 mg/dL (calc) — ABNORMAL HIGH
Non-HDL Cholesterol (Calc): 120 mg/dL (calc) (ref <130)
Total CHOL/HDL Ratio: 2.9 (calc) (ref <5.0)
Triglycerides: 104 mg/dL (ref <150)

## 2020-11-05 LAB — VITAMIN D 1,25 DIHYDROXY
Vitamin D 1, 25 (OH)2 Total: 40 pg/mL (ref 18–72)
Vitamin D2 1, 25 (OH)2: <8 pg/mL
Vitamin D3 1, 25 (OH)2: 40 pg/mL

## 2020-11-05 LAB — CBC WITH DIFFERENTIAL/PLATELET
Absolute Monocytes: 361 cells/uL (ref 200–950)
Basophils Absolute: 30 cells/uL (ref 0–200)
Basophils Relative: 0.7 %
Eosinophils Absolute: 172 cells/uL (ref 15–500)
Eosinophils Relative: 4 %
HCT: 39.9 % (ref 35.0–45.0)
Hemoglobin: 13.3 g/dL (ref 11.7–15.5)
Lymphs Abs: 1402 cells/uL (ref 850–3900)
MCH: 31.4 pg (ref 27.0–33.0)
MCHC: 33.3 g/dL (ref 32.0–36.0)
MCV: 94.1 fL (ref 80.0–100.0)
MPV: 9.9 fL (ref 7.5–12.5)
Monocytes Relative: 8.4 %
Neutro Abs: 2335 cells/uL (ref 1500–7800)
Neutrophils Relative %: 54.3 %
Platelets: 258 10*3/uL (ref 140–400)
RBC: 4.24 10*6/uL (ref 3.80–5.10)
RDW: 12.6 % (ref 11.0–15.0)
Total Lymphocyte: 32.6 %
WBC: 4.3 10*3/uL (ref 3.8–10.8)

## 2020-11-05 LAB — COMPLETE METABOLIC PANEL WITH GFR
AG Ratio: 1.7 (calc) (ref 1.0–2.5)
ALT: 24 U/L (ref 6–29)
AST: 22 U/L (ref 10–35)
Albumin: 4 g/dL (ref 3.6–5.1)
Alkaline phosphatase (APISO): 63 U/L (ref 37–153)
BUN: 13 mg/dL (ref 7–25)
CO2: 24 mmol/L (ref 20–32)
Calcium: 9.3 mg/dL (ref 8.6–10.4)
Chloride: 102 mmol/L (ref 98–110)
Creat: 0.72 mg/dL (ref 0.60–0.88)
GFR, Est African American: 91 mL/min/{1.73_m2} (ref >=60)
GFR, Est Non African American: 79 mL/min/{1.73_m2} (ref >=60)
Globulin: 2.4 g/dL (calc) (ref 1.9–3.7)
Glucose, Bld: 92 mg/dL (ref 65–99)
Potassium: 4.5 mmol/L (ref 3.5–5.3)
Sodium: 136 mmol/L (ref 135–146)
Total Bilirubin: 0.2 mg/dL (ref 0.2–1.2)
Total Protein: 6.4 g/dL (ref 6.1–8.1)

## 2020-11-05 LAB — TSH: TSH: 3.79 mIU/L (ref 0.40–4.50)

## 2020-12-02 ENCOUNTER — Other Ambulatory Visit: Payer: Self-pay

## 2020-12-02 ENCOUNTER — Encounter: Payer: Self-pay | Admitting: Nurse Practitioner

## 2020-12-02 ENCOUNTER — Non-Acute Institutional Stay: Payer: Medicare Other | Admitting: Nurse Practitioner

## 2020-12-02 DIAGNOSIS — K219 Gastro-esophageal reflux disease without esophagitis: Secondary | ICD-10-CM

## 2020-12-02 DIAGNOSIS — E039 Hypothyroidism, unspecified: Secondary | ICD-10-CM | POA: Diagnosis not present

## 2020-12-02 DIAGNOSIS — N952 Postmenopausal atrophic vaginitis: Secondary | ICD-10-CM

## 2020-12-02 DIAGNOSIS — K5901 Slow transit constipation: Secondary | ICD-10-CM

## 2020-12-02 DIAGNOSIS — E785 Hyperlipidemia, unspecified: Secondary | ICD-10-CM | POA: Diagnosis not present

## 2020-12-02 DIAGNOSIS — E559 Vitamin D deficiency, unspecified: Secondary | ICD-10-CM

## 2020-12-02 NOTE — Assessment & Plan Note (Signed)
on Levothyroxine qd, TSH 3.79 11/01/20

## 2020-12-02 NOTE — Assessment & Plan Note (Addendum)
MiraLax qod, some how floated occasionally, but less than prior.  Linzess was too strong.

## 2020-12-02 NOTE — Assessment & Plan Note (Signed)
managed with Estradiol 0.25mg qd.     

## 2020-12-02 NOTE — Assessment & Plan Note (Signed)
Vit D level 40, continue Vit D supplement.

## 2020-12-02 NOTE — Assessment & Plan Note (Signed)
takes Fish oil, ASA 81mg  qd, LDL 100 11/01/20

## 2020-12-02 NOTE — Progress Notes (Signed)
Location:   Clinic [facility]   Place of Service:  Clinic (12) Provider: Chipper Oman NP  Code Status: DNR Goals of Care: [place] 12/02/2020  Does Patient Have a Medical Advance Directive? Yes  Type of Estate agent of [place];Out of facility DNR (pink MOST or yellow form)  Does patient want to make changes to medical advance directive? No - Patient declined  Copy of Healthcare Power of Attorney in Chart? Yes - validated most recent copy scanned in chart (See row information)  Pre-existing out of facility DNR order (yellow form or pink MOST form) Pink MOST form placed in chart (order not valid for inpatient use)     Chief Complaint  Patient presents with   Medical Management of Chronic Issues    4 month follow up    Health Maintenance    Discuss need for td/tdap vaccine, shingles vaccine, and PNA vaccine.      HPI: Patient is a 80 y.o. female seen today for medical management of chronic diseases.    GERD, stable, off Famotidine 20mg  bid. Hgb 13.3 11/01/20             Postmenopausal symptoms, managed with Estradiol 0.25mg  qd.                 Hypothyroidism, on Levothyroxine 11/03/20 qd, TSH 3.79 11/01/20             Hyperlipidemia, takes Fish oil, ASA 81mg  qd, LDL 100 11/01/20             Constipation, MiraLax qod, some how floated. Linzess is too strong.              Vitamin D deficiency, takes D, Vit D level 40    Past Medical History:  Diagnosis Date   Arthritis    Chronic constipation    CKD (chronic kidney disease)    Gallstones    History of kidney stones    Interstitial cystitis    Thyroid disease    UTI (urinary tract infection)     Past Surgical History:  Procedure Laterality Date   CATARACT EXTRACTION Bilateral    CHOLECYSTECTOMY     EYE SURGERY Bilateral    Cat Sx   PARTIAL HYSTERECTOMY     TUBAL LIGATION      Allergies  Allergen Reactions   Ciprofloxacin    Diflucan [Fluconazole]    Sulfa Antibiotics     Allergies as  of 12/02/2020       Reactions   Ciprofloxacin    Diflucan [fluconazole]    Sulfa Antibiotics         Medication List        Accurate as of December 02, 2020  4:28 PM. If you have any questions, ask your nurse or doctor.          aspirin EC 81 MG tablet Take 81 mg by mouth daily.   calcium carbonate 1500 (600 Ca) MG Tabs tablet Commonly known as: OSCAL Take 600 mg of elemental calcium by mouth daily with breakfast.   EQL Vitamin D3 50 MCG (2000 UT) Caps Generic drug: Cholecalciferol Take 2,000 Units by mouth daily.   estradiol 1 MG tablet Commonly known as: ESTRACE INSERT 1/2 TABLET VAGINALLY THREE TIMES PER WEEK.   FISH OIL PO Take 2,000 mg by mouth daily.   GLUCOSAMINE 1500 COMPLEX PO Take 1,500 mg by mouth daily.   levothyroxine 75 MCG tablet Commonly known as: SYNTHROID TAKE 1/2 TABLET (37.5 MCG  TOTAL) BY MOUTH DAILY BEFORE BREAKFAST.   MULTIVITAMIN ADULT PO Take 20 mg by mouth.   multivitamin-lutein Caps capsule Take 1 capsule by mouth daily.   polyethylene glycol 17 g packet Commonly known as: MIRALAX / GLYCOLAX Take 17 g by mouth. Every other night        Review of Systems:  Review of Systems  Constitutional:  Negative for activity change, appetite change and fever.  HENT:  Positive for hearing loss. Negative for congestion and voice change.   Eyes:  Negative for visual disturbance.  Respiratory:  Negative for cough and shortness of breath.   Cardiovascular:  Negative for leg swelling.  Gastrointestinal:  Negative for abdominal pain, constipation, nausea and vomiting.       Bloated after Laxatives.   Genitourinary:  Negative for difficulty urinating, dysuria and urgency.       Urination x1/night.   Musculoskeletal:  Negative for gait problem.  Skin:  Negative for color change.  Neurological:  Negative for speech difficulty, weakness and headaches.  Psychiatric/Behavioral:  Negative for behavioral problems and sleep disturbance. The patient is  not nervous/anxious.        5 hours average sleep at night.    Health Maintenance  Topic Date Due   TETANUS/TDAP  Never done   Zoster Vaccines- Shingrix (1 of 2) Never done   PNA vac Low Risk Adult (1 of 2 - PCV13) Never done   INFLUENZA VACCINE  12/20/2020   DEXA SCAN  Completed   COVID-19 Vaccine  Completed   HPV VACCINES  Aged Out    Physical Exam: Vitals:   12/02/20 1418  BP: 134/80  Pulse: 86  Resp: 18  Temp: (!) 97.5 F (36.4 C)  SpO2: 97%  Weight: 140 lb 1.6 oz (63.5 kg)  Height: 5\' 2"  (1.575 m)   Body mass index is 25.62 kg/m. Physical Exam Vitals and nursing note reviewed.  Constitutional:      Appearance: Normal appearance.     Comments: Over weight.   HENT:     Head: Normocephalic and atraumatic.     Mouth/Throat:     Mouth: Mucous membranes are moist.  Eyes:     Extraocular Movements: Extraocular movements intact.     Conjunctiva/sclera: Conjunctivae normal.     Pupils: Pupils are equal, round, and reactive to light.  Cardiovascular:     Rate and Rhythm: Normal rate and regular rhythm.     Heart sounds: No murmur heard. Pulmonary:     Effort: Pulmonary effort is normal.     Breath sounds: No rales.  Abdominal:     Palpations: Abdomen is soft.     Tenderness: There is no abdominal tenderness.     Comments: Shifting dullness on percussion.   Musculoskeletal:     Cervical back: Normal range of motion and neck supple.     Right lower leg: No edema.     Left lower leg: No edema.  Skin:    General: Skin is warm and dry.  Neurological:     General: No focal deficit present.     Mental Status: She is alert and oriented to person, place, and time. Mental status is at baseline.     Gait: Gait normal.  Psychiatric:        Mood and Affect: Mood normal.        Behavior: Behavior normal.        Thought Content: Thought content normal.        Judgment: Judgment normal.  Labs reviewed: Basic Metabolic Panel: Recent Labs    01/19/20 0838  04/21/20 1344 11/01/20 1043  NA  --  140 136  K  --  4.7 4.5  CL  --  104 102  CO2  --  23 24  GLUCOSE  --  94 92  BUN  --  10 13  CREATININE  --  0.74 0.72  CALCIUM  --  9.5 9.3  TSH 4.80* 3.34 3.79   Liver Function Tests: Recent Labs    04/21/20 1344 11/01/20 1043  AST 24 22  ALT 22 24  BILITOT 0.4 0.2  PROT 6.7 6.4   No results for input(s): LIPASE, AMYLASE in the last 8760 hours. No results for input(s): AMMONIA in the last 8760 hours. CBC: Recent Labs    04/21/20 1344 11/01/20 1043  WBC 4.7 4.3  NEUTROABS 2,773 2,335  HGB 13.5 13.3  HCT 39.7 39.9  MCV 92.5 94.1  PLT 262 258   Lipid Panel: Recent Labs    04/21/20 1344 11/01/20 1043  CHOL 193 183  HDL 69 63  LDLCALC 107* 100*  TRIG 79 104  CHOLHDL 2.8 2.9   No results found for: HGBA1C  Procedures since last visit: No results found.  Assessment/Plan  GERD (gastroesophageal reflux disease) stable, off Famotidine 20mg  bid. Hgb 13.3 11/01/20  Atrophic vaginitis  managed with Estradiol 0.25mg  qd.      Hypothyroidism on Levothyroxine 11/03/20 qd, TSH 3.79 11/01/20  Hyperlipidemia takes Fish oil, ASA 81mg  qd, LDL 100 11/01/20  Vitamin D deficiency Vit D level 40, continue Vit D supplement.   Slow transit constipation MiraLax qod, some how floated occasionally, but less than prior.  Linzess was too strong.    Labs/tests ordered:  none  Next appt:  6 months

## 2020-12-02 NOTE — Assessment & Plan Note (Signed)
stable, off Famotidine 20mg  bid. Hgb 13.3 11/01/20

## 2020-12-03 ENCOUNTER — Encounter: Payer: Medicare Other | Admitting: Internal Medicine

## 2020-12-29 NOTE — Progress Notes (Signed)
[facility] Note  12/30/2020     CHIEF COMPLAINT Patient presents for Retina Follow Up   HISTORY OF PRESENT ILLNESS: Claudia Hunt is a 80 y.o. female who presents to the clinic today for:   HPI     Retina Follow Up   Patient presents with  Other.  In both eyes.  Duration of 6 months.  Since onset it is stable.  I, the attending physician,  performed the HPI with the patient and updated documentation appropriately.        Comments   Pt here for 6 mo ret f/u ERM OU. Pt states vision is about the same though reports seeing floaters intermittently. Some days worse than others, OD is usually worse than OS. No flashes of light reported. No ocular pain or discomfort.       Last edited by Bernarda Caffey, MD on 12/30/2020 11:05 AM.      Referring physician: Virgie Dad, MD [place],  [place] [phone]  HISTORICAL INFORMATION:   Selected notes from the MEDICAL RECORD NUMBER Referred by Dr. Lyndel Safe   CURRENT MEDICATIONS: No current outpatient medications on file. (Ophthalmic Drugs)   No current facility-administered medications for this visit. (Ophthalmic Drugs)   Current Outpatient Medications (Other)  Medication Sig   aspirin EC 81 MG tablet Take 81 mg by mouth daily.   calcium carbonate (OSCAL) 1500 (600 Ca) MG TABS tablet Take 600 mg of elemental calcium by mouth daily with breakfast.   Cholecalciferol (EQL VITAMIN D3) 50 MCG (2000 UT) CAPS Take 2,000 Units by mouth daily.   estradiol (ESTRACE) 1 MG tablet INSERT 1/2 TABLET VAGINALLY THREE TIMES PER WEEK.   Glucosamine-Chondroit-Vit C-Mn (GLUCOSAMINE 1500 COMPLEX PO) Take 1,500 mg by mouth daily.   levothyroxine (SYNTHROID) 75 MCG tablet TAKE 1/2 TABLET (37.5 MCG TOTAL) BY MOUTH DAILY BEFORE BREAKFAST.   Multiple Vitamin (MULTIVITAMIN ADULT PO) Take 20 mg by mouth.   multivitamin-lutein (OCUVITE-LUTEIN) CAPS capsule Take 1 capsule by mouth daily.   Omega-3 Fatty Acids  (FISH OIL PO) Take 2,000 mg by mouth daily.   polyethylene glycol (MIRALAX / GLYCOLAX) 17 g packet Take 17 g by mouth. Every other night   No current facility-administered medications for this visit. (Other)    REVIEW OF SYSTEMS: ROS   Positive for: Gastrointestinal, Genitourinary, Musculoskeletal, Eyes Negative for: Constitutional, Neurological, Skin, HENT, Endocrine, Cardiovascular, Respiratory, Psychiatric, Allergic/Imm, Heme/Lymph Last edited by Kingsley Spittle, COT on 12/30/2020  9:33 AM.     ALLERGIES Allergies  Allergen Reactions   Ciprofloxacin    Diflucan [Fluconazole]    Sulfa Antibiotics     PAST MEDICAL HISTORY Past Medical History:  Diagnosis Date   Arthritis    Chronic constipation    CKD (chronic kidney disease)    Gallstones    History of kidney stones    Interstitial cystitis    Thyroid disease    UTI (urinary tract infection)    Past Surgical History:  Procedure Laterality Date   CATARACT EXTRACTION Bilateral    CHOLECYSTECTOMY     EYE SURGERY Bilateral    Cat Sx   PARTIAL HYSTERECTOMY     TUBAL LIGATION      FAMILY HISTORY Family History  Problem Relation Age of Onset   Cancer Father    Diabetes Father    Prostate cancer Brother    Colon cancer Paternal Uncle     SOCIAL HISTORY Social History   Tobacco Use  Smoking status: Never   Smokeless tobacco: Never  Vaping Use   Vaping Use: Never used  Substance Use Topics   Alcohol use: Yes    Alcohol/week: 2.0 standard drinks    Types: 2 Glasses of wine per week    Comment: weekly   Drug use: Never         OPHTHALMIC EXAM:  Base Eye Exam     Visual Acuity (Snellen - Linear)       Right Left   Dist [place] 20/20 -1 20/25 -1         Tonometry (Tonopen, 9:40 AM)       Right Left   Pressure 15 15         Pupils       Dark Light Shape React APD   Right 2 1 Round Minimal None   Left 2 1 Round Minimal None         Visual Fields (Counting fingers)       Left Right     Full Full         Extraocular Movement       Right Left    Full, Ortho Full, Ortho         Neuro/Psych     Oriented x3: Yes   Mood/Affect: Normal         Dilation     Both eyes: 1.0% Mydriacyl, 2.5% Phenylephrine @ 9:41 AM           Slit Lamp and Fundus Exam     Slit Lamp Exam       Right Left   Lids/Lashes Dermatochalasis, mild MGD Dermatochalasis, mild Meibomian gland dysfunction   Conjunctiva/Sclera white and quiet white and quiet   Cornea mild arcus, trace Punctate epithelial erosions mild arcus, trace Punctate epithelial erosions, mild Debris in tear film   Anterior Chamber deep and clear deep and clear   Iris round--moderate dilation to 5.25 mm round--moderate dilation 5.75 mm   Lens well centered PCIOL, 1+PCO noncentral, nasal well centered PCIOL, good position   Vitreous syneresis, PVD, vitreous condensations inferiorly syneresis, Posterior vitreous detachment, vitreous condensations inferiorly         Fundus Exam       Right Left   Disc mild pallor, sharp rim, +PPA mild pallor, sharp rim, mild temporal PPA   C/D Ratio 0.1 0.2   Macula Flat,blunted foveal reflex, mild RPE mottling and clumping, mlld ERM with trace cystic changes -- stable, no heme Flat,blunted foveal reflex, mild RPE mottling and clumping, mild ERM with trace cystic changes -- stable, no heme   Vessels mild attenuation, tortuousity mild attenuation, tortuousity   Periphery Attached, mild reticular degeneration, no heme attached, no heme            IMAGING AND PROCEDURES  Imaging and Procedures for 12/30/2020  OCT, Retina - OU - Both Eyes       Right Eye Quality was good. Central Foveal Thickness: 280. Progression has been stable. Findings include normal foveal contour, epiretinal membrane, vitreomacular adhesion , no SRF, intraretinal fluid.   Left Eye Quality was good. Central Foveal Thickness: 336. Progression has been stable. Findings include abnormal foveal  contour, intraretinal fluid, no SRF, epiretinal membrane, vitreomacular adhesion .   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 CME - cystoid macular edema.  PED - pigment epithelial detachment. IRF - intraretinal fluid. SRF - subretinal fluid. EZ - ellipsoid zone. ERM - epiretinal membrane. ORA - outer retinal atrophy. ORT - outer retinal tubulation. SRHM - subretinal hyper-reflective material. IRHM - intraretinal hyper-reflective material      Fluorescein Angiography Optos (Transit OS)       Right Eye Progression has no prior data. Early phase findings include staining, window defect, vascular perfusion defect (Mild peripheral vascular non perfusion ). Mid/Late phase findings include staining, window defect, vascular perfusion defect (Mild peripheral vascular non perfusion -- no leakage/CME).   Left Eye Progression has no prior data. Early phase findings include staining, window defect, vascular perfusion defect (Mild peripheral vascular non perfusion ). Mid/Late phase findings include staining, window defect, vascular perfusion defect (Mild peripheral vascular non perfusion -- no leakage/CME).   Notes **Images stored on drive**  Impression: no central leakage/CME OU Mild peripheral vascular non perfusion OU            ASSESSMENT/PLAN:    ICD-10-CM   1. Epiretinal membrane (ERM) of both eyes  H35.373     2. 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 Retinal edema  H35.81 OCT, Retina - OU - Both Eyes    4. Essential hypertension  I10     5. Hypertensive retinopathy of both eyes  H35.033 Fluorescein Angiography Optos (Transit OS)    6. Pseudophakia of both eyes  Z96.1        1-3. Epiretinal membrane with mild foveoschisis OU (OS>OD) -- stable - The natural history, anatomy,  potential for loss of vision, and treatment options including vitrectomy techniques and the complications of endophthalmitis, retinal detachment, vitreous hemorrhage, cataract progression and permanent vision loss discussed with the patient. - mild ERM OU  - asymptomatic, no metamorphopsia  - BCVA 20/20 OD, 20/25 today  - OCT shows Mild ERM with cystic changes / retinoschisis OU (OS>OD) -- stable   - FA today, 08.11.22 shows no central leakage/CME corresponding to central cystic changes suggestive of schisis - no indication for surgery at this time  - monitor for now   - f/u 9-12 mos -- DFE/OCT  4,5. Hypertensive retinopathy OU - discussed importance of tight BP control - monitor   6. Pseudophakia OU  - s/p CE/IOL OU (in [place])  - IOLs in good position, doing well  - monitor    Ophthalmic Meds Ordered this visit:  No orders of the defined types were placed in this encounter.  This document serves as a record of services personally performed by Gardiner Sleeper, MD, PhD. It was created on their behalf by Leonie Douglas, an ophthalmic technician. The creation of this record is the provider's dictation and/or activities during the visit.    Electronically signed by: Leonie Douglas COA, 12/30/20  11:07 AM   Gardiner Sleeper, M.D., Ph.D. Diseases & Surgery of the Retina and Vitreous [facility] 12/29/2020   I have reviewed the above documentation for accuracy and completeness, and I agree with the above. Gardiner Sleeper, M.D., Ph.D. 12/30/20 11:07 AM   Abbreviations: M myopia (nearsighted); A astigmatism; H hyperopia (farsighted); P presbyopia; Mrx spectacle prescription;  CTL contact lenses; OD right eye; OS left eye; OU both eyes  XT exotropia; ET esotropia; PEK punctate epithelial keratitis; PEE punctate epithelial erosions; DES dry eye syndrome; MGD meibomian gland dysfunction; ATs artificial tears; PFAT's preservative free artificial tears; [place] nuclear  sclerotic cataract; [place]  posterior subcapsular cataract; ERM epi-retinal membrane; PVD posterior vitreous detachment; RD retinal detachment; DM diabetes mellitus; DR diabetic retinopathy; NPDR non-proliferative diabetic retinopathy; PDR proliferative diabetic retinopathy; CSME clinically significant macular edema; DME diabetic macular edema; dbh dot blot hemorrhages; CWS cotton wool spot; POAG primary open angle glaucoma; C/D cup-to-disc ratio; HVF humphrey visual field; GVF goldmann visual field; OCT optical coherence tomography; IOP intraocular pressure; BRVO Branch retinal vein occlusion; CRVO central retinal vein occlusion; CRAO central retinal artery occlusion; BRAO branch retinal artery occlusion; RT retinal tear; SB scleral buckle; PPV pars plana vitrectomy; VH Vitreous hemorrhage; PRP panretinal laser photocoagulation; IVK intravitreal kenalog; VMT vitreomacular traction; MH Macular hole;  NVD neovascularization of the disc; NVE neovascularization elsewhere; AREDS age related eye disease study; ARMD age related macular degeneration; POAG primary open angle glaucoma; EBMD epithelial/anterior basement membrane dystrophy; ACIOL anterior chamber intraocular lens; IOL intraocular lens; PCIOL posterior chamber intraocular lens; Phaco/IOL phacoemulsification with intraocular lens placement; [place] photorefractive keratectomy; LASIK laser assisted in situ keratomileusis; HTN hypertension; DM diabetes mellitus; COPD chronic obstructive pulmonary disease

## 2020-12-30 ENCOUNTER — Encounter (INDEPENDENT_AMBULATORY_CARE_PROVIDER_SITE_OTHER): Payer: Self-pay | Admitting: Ophthalmology

## 2020-12-30 ENCOUNTER — Ambulatory Visit (INDEPENDENT_AMBULATORY_CARE_PROVIDER_SITE_OTHER): Payer: Medicare Other | Admitting: Ophthalmology

## 2020-12-30 ENCOUNTER — Other Ambulatory Visit: Payer: Self-pay

## 2020-12-30 DIAGNOSIS — H33193 Other retinoschisis and retinal cysts, bilateral: Secondary | ICD-10-CM

## 2020-12-30 DIAGNOSIS — H35373 Puckering of macula, bilateral: Secondary | ICD-10-CM

## 2020-12-30 DIAGNOSIS — H35033 Hypertensive retinopathy, bilateral: Secondary | ICD-10-CM

## 2020-12-30 DIAGNOSIS — H3581 Retinal edema: Secondary | ICD-10-CM

## 2020-12-30 DIAGNOSIS — I10 Essential (primary) hypertension: Secondary | ICD-10-CM | POA: Diagnosis not present

## 2020-12-30 DIAGNOSIS — Z961 Presence of intraocular lens: Secondary | ICD-10-CM

## 2021-01-20 ENCOUNTER — Other Ambulatory Visit: Payer: Self-pay | Admitting: Internal Medicine

## 2021-01-20 DIAGNOSIS — M8580 Other specified disorders of bone density and structure, unspecified site: Secondary | ICD-10-CM

## 2021-01-26 ENCOUNTER — Ambulatory Visit
Admission: RE | Admit: 2021-01-26 | Discharge: 2021-01-26 | Disposition: A | Discharge status: Home / Self Care | Payer: Medicare Other | Source: Ambulatory Visit | Attending: Internal Medicine | Admitting: Internal Medicine

## 2021-01-26 ENCOUNTER — Other Ambulatory Visit: Payer: Medicare Other

## 2021-01-26 ENCOUNTER — Other Ambulatory Visit: Payer: Self-pay

## 2021-01-26 DIAGNOSIS — M8580 Other specified disorders of bone density and structure, unspecified site: Secondary | ICD-10-CM

## 2021-04-18 ENCOUNTER — Other Ambulatory Visit: Payer: Self-pay | Admitting: Internal Medicine

## 2021-04-18 DIAGNOSIS — Z1231 Encounter for screening mammogram for malignant neoplasm of breast: Secondary | ICD-10-CM

## 2021-05-15 ENCOUNTER — Other Ambulatory Visit: Payer: Self-pay | Admitting: Nurse Practitioner

## 2021-05-15 DIAGNOSIS — E039 Hypothyroidism, unspecified: Secondary | ICD-10-CM

## 2021-05-20 ENCOUNTER — Ambulatory Visit
Admission: RE | Admit: 2021-05-20 | Discharge: 2021-05-20 | Disposition: A | Discharge status: Home / Self Care | Payer: Medicare Other | Source: Ambulatory Visit | Attending: Internal Medicine | Admitting: Internal Medicine

## 2021-05-20 DIAGNOSIS — Z1231 Encounter for screening mammogram for malignant neoplasm of breast: Secondary | ICD-10-CM

## 2021-05-31 DIAGNOSIS — L814 Other melanin hyperpigmentation: Secondary | ICD-10-CM | POA: Diagnosis not present

## 2021-05-31 DIAGNOSIS — L820 Inflamed seborrheic keratosis: Secondary | ICD-10-CM | POA: Diagnosis not present

## 2021-05-31 DIAGNOSIS — L570 Actinic keratosis: Secondary | ICD-10-CM | POA: Diagnosis not present

## 2021-06-09 ENCOUNTER — Encounter: Payer: Self-pay | Admitting: Nurse Practitioner

## 2021-06-09 ENCOUNTER — Non-Acute Institutional Stay: Payer: Medicare HMO | Admitting: Nurse Practitioner

## 2021-06-09 ENCOUNTER — Other Ambulatory Visit: Payer: Self-pay

## 2021-06-09 DIAGNOSIS — N952 Postmenopausal atrophic vaginitis: Secondary | ICD-10-CM

## 2021-06-09 DIAGNOSIS — K5901 Slow transit constipation: Secondary | ICD-10-CM | POA: Diagnosis not present

## 2021-06-09 DIAGNOSIS — E785 Hyperlipidemia, unspecified: Secondary | ICD-10-CM | POA: Diagnosis not present

## 2021-06-09 DIAGNOSIS — K219 Gastro-esophageal reflux disease without esophagitis: Secondary | ICD-10-CM | POA: Diagnosis not present

## 2021-06-09 DIAGNOSIS — E559 Vitamin D deficiency, unspecified: Secondary | ICD-10-CM | POA: Diagnosis not present

## 2021-06-09 DIAGNOSIS — E039 Hypothyroidism, unspecified: Secondary | ICD-10-CM

## 2021-06-09 NOTE — Assessment & Plan Note (Signed)
stable, off Famotidine 20mg bid. Hgb 13.3 11/01/20 

## 2021-06-09 NOTE — Progress Notes (Signed)
Location:   clinic [place]   Place of Service:  Clinic (12) Provider: Marlana Latus NP  Code Status: DNR Goals of Care: [place] 06/09/2021  Does Patient Have a Medical Advance Directive? Yes  Type of Paramedic of [place];Living will;Out of facility DNR (pink MOST or yellow form)  Does patient want to make changes to medical advance directive? No - Patient declined  Copy of [place] in Chart? Yes - validated most recent copy scanned in chart (See row information)  Pre-existing out of facility DNR order (yellow form or pink MOST form) Pink MOST form placed in chart (order not valid for inpatient use)     Chief Complaint  Patient presents with   Medical Management of Chronic Issues    6 month follow-up and discuss need for td/tdap, shingrix, and PCV or post pone if patient refuses. If patient had vaccines she will need to provide documentation.     HPI: Patient is a 81 y.o. female seen today for medical management of chronic diseases.     GERD, stable, off Famotidine 20mg  bid. Hgb 13.3 11/01/20             Postmenopausal symptoms, managed with Estradiol 0.25mg  qd.                 Hypothyroidism, on Levothyroxine 68mcg qd, TSH 3.79 11/01/20             Hyperlipidemia, takes Fish oil, ASA 81mg  qd, LDL 100 11/01/20             Constipation, MiraLax qod, some how floated. Linzess is too strong.              Vitamin D deficiency, takes D, Vit D level 40 Past Medical History:  Diagnosis Date   Arthritis    Chronic constipation    CKD (chronic kidney disease)    Gallstones    History of kidney stones    Interstitial cystitis    Thyroid disease    UTI (urinary tract infection)     Past Surgical History:  Procedure Laterality Date   CATARACT EXTRACTION Bilateral    CHOLECYSTECTOMY     EYE SURGERY Bilateral    Cat Sx   PARTIAL HYSTERECTOMY     TUBAL LIGATION      Allergies  Allergen Reactions   Ciprofloxacin    Diflucan  [Fluconazole]    Sulfa Antibiotics     Allergies as of 06/09/2021       Reactions   Ciprofloxacin    Diflucan [fluconazole]    Sulfa Antibiotics         Medication List        Accurate as of June 09, 2021 11:59 PM. If you have any questions, ask your nurse or doctor.          aspirin EC 81 MG tablet Take 81 mg by mouth daily.   calcium carbonate 1500 (600 Ca) MG Tabs tablet Commonly known as: OSCAL Take 600 mg of elemental calcium by mouth daily with breakfast.   EQL Vitamin D3 50 MCG (2000 UT) Caps Generic drug: Cholecalciferol Take 2,000 Units by mouth daily.   estradiol 1 MG tablet Commonly known as: ESTRACE INSERT 1/2 TABLET VAGINALLY THREE TIMES PER WEEK.   FISH OIL PO Take 2,000 mg by mouth daily.   GLUCOSAMINE 1500 COMPLEX PO Take 1,500 mg by mouth daily.   levothyroxine 75 MCG tablet Commonly known as: SYNTHROID TAKE 1/2 TABLET (  37.5 MCG TOTAL) BY MOUTH DAILY BEFORE BREAKFAST.   MULTIVITAMIN ADULT PO Take 20 mg by mouth.   multivitamin-lutein Caps capsule Take 1 capsule by mouth daily.   polyethylene glycol 17 g packet Commonly known as: MIRALAX / GLYCOLAX Take 17 g by mouth. Every other night        Review of Systems:  Review of Systems  Constitutional:  Negative for activity change, appetite change and fever.  HENT:  Positive for hearing loss. Negative for congestion and voice change.   Eyes:  Negative for visual disturbance.  Respiratory:  Negative for cough and shortness of breath.   Cardiovascular:  Negative for leg swelling.  Gastrointestinal:  Negative for abdominal pain, constipation, nausea and vomiting.       Bloated after Laxatives.   Genitourinary:  Negative for difficulty urinating, dysuria and urgency.       Urination x1/night.   Musculoskeletal:  Negative for gait problem.  Skin:  Negative for color change.  Neurological:  Negative for speech difficulty, weakness and headaches.  Psychiatric/Behavioral:  Negative for  behavioral problems and sleep disturbance. The patient is not nervous/anxious.        5 hours average sleep at night.    Health Maintenance  Topic Date Due   Zoster Vaccines- Shingrix (1 of 2) 01/10/2022 (Originally 11/07/1990)   TETANUS/TDAP  05/22/2030   Pneumonia Vaccine 13+ Years old  Completed   INFLUENZA VACCINE  Completed   DEXA SCAN  Completed   COVID-19 Vaccine  Completed   HPV VACCINES  Aged Out    Physical Exam: Vitals:   06/09/21 1434  BP: 132/80  Pulse: 83  Temp: (!) 97.3 F (36.3 C)  TempSrc: Temporal  SpO2: 99%  Weight: 142 lb (64.4 kg)  Height: 5\' 2"  (1.575 m)   Body mass index is 25.97 kg/m. Physical Exam Vitals and nursing note reviewed.  Constitutional:      Appearance: Normal appearance.     Comments: Over weight.   HENT:     Head: Normocephalic and atraumatic.     Mouth/Throat:     Mouth: Mucous membranes are moist.  Eyes:     Extraocular Movements: Extraocular movements intact.     Conjunctiva/sclera: Conjunctivae normal.     Pupils: Pupils are equal, round, and reactive to light.  Cardiovascular:     Rate and Rhythm: Normal rate and regular rhythm.     Heart sounds: No murmur heard. Pulmonary:     Effort: Pulmonary effort is normal.     Breath sounds: No rales.  Abdominal:     Palpations: Abdomen is soft.     Tenderness: There is no abdominal tenderness.     Comments: Shifting dullness on percussion.   Musculoskeletal:     Cervical back: Normal range of motion and neck supple.     Right lower leg: No edema.     Left lower leg: No edema.  Skin:    General: Skin is warm and dry.  Neurological:     General: No focal deficit present.     Mental Status: She is alert and oriented to person, place, and time. Mental status is at baseline.     Gait: Gait normal.  Psychiatric:        Mood and Affect: Mood normal.        Behavior: Behavior normal.        Thought Content: Thought content normal.        Judgment: Judgment normal.    Labs  reviewed: Basic Metabolic Panel: Recent Labs    11/01/20 1043  NA 136  K 4.5  CL 102  CO2 24  GLUCOSE 92  BUN 13  CREATININE 0.72  CALCIUM 9.3  TSH 3.79   Liver Function Tests: Recent Labs    11/01/20 1043  AST 22  ALT 24  BILITOT 0.2  PROT 6.4   No results for input(s): LIPASE, AMYLASE in the last 8760 hours. No results for input(s): AMMONIA in the last 8760 hours. CBC: Recent Labs    11/01/20 1043  WBC 4.3  NEUTROABS 2,335  HGB 13.3  HCT 39.9  MCV 94.1  PLT 258   Lipid Panel: Recent Labs    11/01/20 1043  CHOL 183  HDL 63  LDLCALC 100*  TRIG 104  CHOLHDL 2.9   No results found for: HGBA1C  Procedures since last visit: MM 3D SCREEN BREAST BILATERAL  Result Date: 05/24/2021 CLINICAL DATA:  Screening. EXAM: DIGITAL SCREENING BILATERAL MAMMOGRAM WITH TOMOSYNTHESIS AND CAD TECHNIQUE: Bilateral screening digital craniocaudal and mediolateral oblique mammograms were obtained. Bilateral screening digital breast tomosynthesis was performed. The images were evaluated with computer-aided detection. COMPARISON:  Previous exam(s). ACR Breast Density Category b: There are scattered areas of fibroglandular density. FINDINGS: There are no findings suspicious for malignancy. IMPRESSION: No mammographic evidence of malignancy. A result letter of this screening mammogram will be mailed directly to the patient. RECOMMENDATION: Screening mammogram in one year. (Code:SM-B-01Y) BI-RADS CATEGORY  1: Negative. Electronically Signed   By: Abelardo Diesel M.D.   On: 05/24/2021 10:17    Assessment/Plan  Hypothyroidism on Levothyroxine 72mcg qd, TSH 3.79 11/01/20, f/u TSH in 6 months.   Hyperlipidemia takes Fish oil, ASA 81mg  qd, LDL 100 11/01/20, f/u lipid panel in 6 months.   Slow transit constipation Stable,  MiraLax qod, some how floated. Linzess is too strong.   Vitamin D deficiency takes D, Vit D level 40  Atrophic vaginitis managed with Estradiol 0.25mg  qd.      GERD  (gastroesophageal reflux disease) stable, off Famotidine 20mg  bid. Hgb 13.3 11/01/20   Labs/tests ordered:  none  Next appt:  07/26/2021

## 2021-06-09 NOTE — Assessment & Plan Note (Signed)
managed with Estradiol 0.25mg qd.     

## 2021-06-09 NOTE — Assessment & Plan Note (Signed)
takes Fish oil, ASA 81mg  qd, LDL 100 11/01/20, f/u lipid panel in 6 months.

## 2021-06-09 NOTE — Assessment & Plan Note (Signed)
takes D, Vit D level 40 

## 2021-06-09 NOTE — Assessment & Plan Note (Signed)
Stable,  MiraLax qod, some how floated. Linzess is too strong.

## 2021-06-09 NOTE — Assessment & Plan Note (Signed)
on Levothyroxine qd, TSH 3.79 11/01/20, f/u TSH in 6 months.

## 2021-06-13 ENCOUNTER — Encounter: Payer: Self-pay | Admitting: Nurse Practitioner

## 2021-07-26 ENCOUNTER — Telehealth: Payer: Self-pay

## 2021-07-26 ENCOUNTER — Other Ambulatory Visit: Payer: Self-pay

## 2021-07-26 ENCOUNTER — Ambulatory Visit (INDEPENDENT_AMBULATORY_CARE_PROVIDER_SITE_OTHER): Payer: Medicare HMO | Admitting: Nurse Practitioner

## 2021-07-26 ENCOUNTER — Encounter: Payer: Self-pay | Admitting: Nurse Practitioner

## 2021-07-26 DIAGNOSIS — Z0000 Encounter for general adult medical examination without abnormal findings: Secondary | ICD-10-CM

## 2021-07-26 NOTE — Progress Notes (Signed)
Subjective:   Claudia Hunt is a 81 y.o. female who presents for Medicare Annual (Subsequent) preventive examination.  Review of Systems     Cardiac Risk Factors include: dyslipidemia;advanced age (>55men, >48 women)     Objective:    There were no vitals filed for this visit. There is no height or weight on file to calculate BMI.  Advanced Directives 07/26/2021 06/09/2021 12/02/2020 09/30/2020 09/13/2020 07/23/2020  Does Patient Have a Medical Advance Directive? Yes Yes Yes Yes Yes Yes  Type of Estate agent of [place];Out of facility DNR (pink MOST or yellow form) Healthcare Power of [place];Living will;Out of facility DNR (pink MOST or yellow form) Healthcare Power of [place];Out of facility DNR (pink MOST or yellow form) Out of facility DNR (pink MOST or yellow form);Healthcare Power of [employer] of facility DNR (pink MOST or yellow form) Out of facility DNR (pink MOST or yellow form)  Does patient want to make changes to medical advance directive? No - Patient declined No - Patient declined No - Patient declined No - Patient declined No - Patient declined No - Patient declined  Copy of Healthcare Power of Attorney in Chart? Yes - validated most recent copy scanned in chart (See row information) Yes - validated most recent copy scanned in chart (See row information) Yes - validated most recent copy scanned in chart (See row information) Yes - validated most recent copy scanned in chart (See row information) - -  Pre-existing out of facility DNR order (yellow form or pink MOST form) Pink MOST form placed in chart (order not valid for inpatient use) Pink MOST form placed in chart (order not valid for inpatient use) Pink MOST form placed in chart (order not valid for inpatient use) Pink MOST form placed in chart (order not valid for inpatient use) Pink MOST form placed in chart (order not valid for inpatient use) Pink MOST form placed in chart (order not valid for  inpatient use)    Current Medications (verified) Outpatient Encounter Medications as of 07/26/2021  Medication Sig   aspirin EC 81 MG tablet Take 81 mg by mouth daily.   calcium carbonate (OSCAL) 1500 (600 Ca) MG TABS tablet Take 600 mg of elemental calcium by mouth daily with breakfast.   Cholecalciferol (EQL VITAMIN D3) 50 MCG (2000 UT) CAPS Take 2,000 Units by mouth daily.   estradiol (ESTRACE) 1 MG tablet INSERT 1/2 TABLET VAGINALLY THREE TIMES PER WEEK.   Glucosamine-Chondroit-Vit C-Mn (GLUCOSAMINE 1500 COMPLEX PO) Take 1,500 mg by mouth daily.   levothyroxine (SYNTHROID) 75 MCG tablet TAKE 1/2 TABLET (37.5 MCG TOTAL) BY MOUTH DAILY BEFORE BREAKFAST.   Multiple Vitamin (MULTIVITAMIN ADULT PO) Take 20 mg by mouth.   multivitamin-lutein (OCUVITE-LUTEIN) CAPS capsule Take 1 capsule by mouth daily.   Omega-3 Fatty Acids (FISH OIL PO) Take 2,000 mg by mouth daily.   polyethylene glycol (MIRALAX / GLYCOLAX) 17 g packet Take 17 g by mouth. Every other night   No facility-administered encounter medications on file as of 07/26/2021.    Allergies (verified) Ciprofloxacin, Diflucan [fluconazole], and Sulfa antibiotics   History: Past Medical History:  Diagnosis Date   Arthritis    Chronic constipation    CKD (chronic kidney disease)    Gallstones    History of kidney stones    Interstitial cystitis    Thyroid disease    UTI (urinary tract infection)    Past Surgical History:  Procedure Laterality Date   CATARACT EXTRACTION Bilateral  CHOLECYSTECTOMY     EYE SURGERY Bilateral    Cat Sx   PARTIAL HYSTERECTOMY     TUBAL LIGATION     Family History  Problem Relation Age of Onset   Cancer Father    Diabetes Father    Prostate cancer Brother    Colon cancer Paternal Uncle    Social History   Socioeconomic History   Marital status: Widowed    Spouse name: Not on file   Number of children: 3   Years of education: Not on file   Highest education level: Not on file   Occupational History   Occupation: retired Runner, broadcasting/film/video   Tobacco Use   Smoking status: Never   Smokeless tobacco: Never  Vaping Use   Vaping Use: Never used  Substance and Sexual Activity   Alcohol use: Yes    Alcohol/week: 2.0 standard drinks    Types: 2 Glasses of wine per week    Comment: weekly   Drug use: Never   Sexual activity: Not on file  Other Topics Concern   Not on file  Social History Narrative   Not on file   Social Determinants of Health   Financial Resource Strain: Not on file  Food Insecurity: Not on file  Transportation Needs: Not on file  Physical Activity: Not on file  Stress: Not on file  Social Connections: Not on file    Tobacco Counseling Counseling given: Not Answered   Clinical Intake:  Pre-visit preparation completed: Yes  Pain : No/denies pain     BMI - recorded: 25 Nutritional Risks: None Diabetes: No  How often do you need to have someone help you when you read instructions, pamphlets, or other written materials from your doctor or pharmacy?: 1 - Never  Diabetic?no         Activities of Daily Living In your present state of health, do you have any difficulty performing the following activities: 07/26/2021  Hearing? N  Vision? N  Difficulty concentrating or making decisions? N  Walking or climbing stairs? N  Dressing or bathing? N  Doing errands, shopping? N  Preparing Food and eating ? N  Using the Toilet? N  In the past six months, have you accidently leaked urine? N  Do you have problems with loss of bowel control? N  Managing your Medications? N  Managing your Finances? N  Housekeeping or managing your Housekeeping? N  Some recent data might be hidden    Patient Care Team: Mast, Man X, NP as PCP - General (Internal Medicine)  Indicate any recent Medical Services you may have received from other than [facility] providers in the past year (date may be approximate).     Assessment:   This is a routine wellness  examination for Claudia Hunt.  Hearing/Vision screen Hearing Screening - Comments:: Patient has no hering problems. Vision Screening - Comments:: Patient has no vision problems. Patient's last eye exam was last fall. Patient sees Dr. Rennis Chris  Dietary issues and exercise activities discussed: Current Exercise Habits: Home exercise routine, Type of exercise: walking;stretching, Time (Minutes): 45, Frequency (Times/Week): 6, Weekly Exercise (Minutes/Week): 270   Goals Addressed   None    Depression Screen PHQ 2/9 Scores 07/26/2021 07/23/2020  PHQ - 2 Score 0 0    Fall Risk Fall Risk  07/26/2021 06/09/2021 12/02/2020 09/30/2020 09/13/2020  Falls in the past year? 0 0 0 0 0  Number falls in past yr: 0 0 0 0 0  Injury with Fall? 0 0  0 0 0  Risk for fall due to : No Fall Risks No Fall Risks No Fall Risks - -  Follow up Falls evaluation completed Falls evaluation completed Falls evaluation completed - -    FALL RISK PREVENTION PERTAINING TO THE HOME:  Any stairs in or around the home? Yes  If so, are there any without handrails? No  Home free of loose throw rugs in walkways, pet beds, electrical cords, etc? Yes  Adequate lighting in your home to reduce risk of falls? Yes   ASSISTIVE DEVICES UTILIZED TO PREVENT FALLS:  Life alert? No  Use of a cane, walker or w/c? No  Grab bars in the bathroom? Yes  Shower chair or bench in shower? No  Elevated toilet seat or a handicapped toilet? Yes   TIMED UP AND GO:  Was the test performed? No .    Cognitive Function:     6CIT Screen 07/26/2021 07/23/2020  What Year? 0 points 0 points  What month? 0 points 0 points  What time? 0 points 0 points  Count back from 20 0 points 0 points  Months in reverse 0 points 0 points  Repeat phrase 2 points 0 points  Total Score 2 0    Immunizations Immunization History  Administered Date(s) Administered   Fluad Quad(high Dose 65+) 03/07/2021   Influenza, High Dose Seasonal PF 02/20/2019, 03/03/2020    Moderna SARS-COV2 Booster Vaccination 03/30/2020, 10/19/2020   Moderna Sars-Covid-2 Vaccination 05/26/2019, 06/23/2019   Pfizer Covid-19 Vaccine Bivalent Booster 32yrs & up 02/08/2021   Pneumococcal Conjugate-13 05/22/2013   Pneumococcal Polysaccharide-23 05/22/2014   Tdap 05/22/2020   Zoster, Live 10/20/2009    TDAP status: Up to date  Flu Vaccine status: Up to date  Pneumococcal vaccine status: Up to date  Covid-19 vaccine status: Completed vaccines  Qualifies for Shingles Vaccine? Yes   Zostavax completed No   Shingrix Completed?: No.    Education has been provided regarding the importance of this vaccine. Patient has been advised to call insurance company to determine out of pocket expense if they have not yet received this vaccine. Advised may also receive vaccine at local pharmacy or Health Dept. Verbalized acceptance and understanding.  Screening Tests Health Maintenance  Topic Date Due   Zoster Vaccines- Shingrix (1 of 2) 01/10/2022 (Originally 11/07/1990)   TETANUS/TDAP  05/22/2030   Pneumonia Vaccine 32+ Years old  Completed   INFLUENZA VACCINE  Completed   DEXA SCAN  Completed   COVID-19 Vaccine  Completed   HPV VACCINES  Aged Out    Health Maintenance  There are no preventive care reminders to display for this patient.  Colorectal cancer screening: No longer required.   Mammogram status: No longer required due to age.  Bone Density status: Completed 01/26/21. Results reflect: Bone density results: OSTEOPENIA. Repeat every 2 years.  Lung Cancer Screening: (Low Dose CT Chest recommended if Age 32-80 years, 30 pack-year currently smoking OR have quit w/in 15years.) does not qualify.   Additional Screening:  Hepatitis C Screening: does not qualify  Vision Screening: Recommended annual ophthalmology exams for early detection of glaucoma and other disorders of the eye. Is the patient up to date with their annual eye exam?  Yes  Who is the provider or what is the  name of the office in which the patient attends annual eye exams? Dr Milagros Loll If pt is not established with a provider, would they like to be referred to a provider to establish care? No .  Dental Screening: Recommended annual dental exams for proper oral hygiene  Community Resource Referral / Chronic Care Management: CRR required this visit?  No   CCM required this visit?  No      Plan:     I have personally reviewed and noted the following in the patients chart:   Medical and social history Use of alcohol, tobacco or illicit drugs  Current medications and supplements including opioid prescriptions.  Functional ability and status Nutritional status Physical activity Advanced directives List of other physicians Hospitalizations, surgeries, and ER visits in previous 12 months Vitals Screenings to include cognitive, depression, and falls Referrals and appointments  In addition, I have reviewed and discussed with patient certain preventive protocols, quality metrics, and best practice recommendations. A written personalized care plan for preventive services as well as general preventive health recommendations were provided to patient.     Sharon Seller, NP   07/26/2021    Virtual Visit via Telephone Note  I connected with patient 07/26/21 at  9:00 AM EST by telephone and verified that I am speaking with the correct person using two identifiers.  Location: Patient: home Provider: [facility]    I discussed the limitations, risks, security and privacy concerns of performing an evaluation and management service by telephone and the availability of in person appointments. I also discussed with the patient that there may be a patient responsible charge related to this service. The patient expressed understanding and agreed to proceed.   I discussed the assessment and treatment plan with the patient. The patient was provided an opportunity to ask questions and all were  answered. The patient agreed with the plan and demonstrated an understanding of the instructions.   The patient was advised to call back or seek an in-person evaluation if the symptoms worsen or if the condition fails to improve as anticipated.  I provided 15 minutes of non-face-to-face time during this encounter.  Janene Harvey. [employer] printed and mailed

## 2021-07-26 NOTE — Progress Notes (Signed)
This service is provided via telemedicine ? ?No vital signs collected/recorded due to the encounter was a telemedicine visit.  ? ?Location of patient (ex: home, work):  Home ? ?Patient consents to a telephone visit:  Yes, see encounter dated 07/26/2021 ? ?Location of the provider (ex: office, home):  [facility] [employer] ? ?Name of any referring provider:  Mast, Man X, NP ? ?Names of all persons participating in the telemedicine service and their role in the encounter:  Abbey Chatters, Nurse Practitioner, Elveria Royals, CMA, and patient.  ? ?Time spent on call:  8 minutes with medical assistant ? ?

## 2021-07-26 NOTE — Telephone Encounter (Signed)
[name], Claudia Hunt are scheduled for a virtual visit with your provider today.   ? ?Just as we do with appointments in the office, we must obtain your consent to participate.  Your consent will be active for this visit and any virtual visit you may have with one of our providers in the next 365 days.   ? ?If you have a MyChart account, I can also send a copy of this consent to you electronically.  All virtual visits are billed to your insurance company just like a traditional visit in the office.  As this is a virtual visit, video technology does not allow for your provider to perform a traditional examination.  This may limit your provider's ability to fully assess your condition.  If your provider identifies any concerns that need to be evaluated in person or the need to arrange testing such as labs, EKG, etc, we will make arrangements to do so.   ? ?Although advances in technology are sophisticated, we cannot ensure that it will always work on either your end or our end.  If the connection with a video visit is poor, we may have to switch to a telephone visit.  With either a video or telephone visit, we are not always able to ensure that we have a secure connection.   I need to obtain your verbal consent now.   Are you willing to proceed with your visit today?  ? ?Claudia Hunt has provided verbal consent on 07/26/2021 for a virtual visit (video or telephone). ? ? ?Elveria Royals, CMA ?07/26/2021  9:04 AM ?  ?

## 2021-07-26 NOTE — Patient Instructions (Signed)
[name] , Thank you for taking time to come for your Medicare Wellness Visit. I appreciate your ongoing commitment to your health goals. Please review the following plan we discussed and let me know if I can assist you in the future.   Screening recommendations/referrals: Colonoscopy aged out Mammogram aged out Bone Density up to date Recommended yearly ophthalmology/optometry visit for glaucoma screening and checkup Recommended yearly dental visit for hygiene and checkup  Vaccinations: Influenza vaccine up to date Pneumococcal vaccine up to date Tdap vaccine up to date Shingles vaccine DUE- recommend to get at your local pharmacy       Advanced directives: on file.   Conditions/risks identified: advanced age.  Next appointment: yearly.    Preventive Care 21 Years and Older, Female Preventive care refers to lifestyle choices and visits with your health care provider that can promote health and wellness. What does preventive care include? A yearly physical exam. This is also called an annual well check. Dental exams once or twice a year. Routine eye exams. Ask your health care provider how often you should have your eyes checked. Personal lifestyle choices, including: Daily care of your teeth and gums. Regular physical activity. Eating a healthy diet. Avoiding tobacco and drug use. Limiting alcohol use. Practicing safe sex. Taking low-dose aspirin every day. Taking vitamin and mineral supplements as recommended by your health care provider. What happens during an annual well check? The services and screenings done by your health care provider during your annual well check will depend on your age, overall health, lifestyle risk factors, and family history of disease. Counseling  Your health care provider may ask you questions about your: Alcohol use. Tobacco use. Drug use. Emotional well-being. Home and relationship well-being. Sexual activity. Eating habits. History  of falls. Memory and ability to understand (cognition). Work and work Astronomer. Reproductive health. Screening  You may have the following tests or measurements: Height, weight, and BMI. Blood pressure. Lipid and cholesterol levels. These may be checked every 5 years, or more frequently if you are over 104 years old. Skin check. Lung cancer screening. You may have this screening every year starting at age 11 if you have a 30-pack-year history of smoking and currently smoke or have quit within the past 15 years. Fecal occult blood test (FOBT) of the stool. You may have this test every year starting at age 60. Flexible sigmoidoscopy or colonoscopy. You may have a sigmoidoscopy every 5 years or a colonoscopy every 10 years starting at age 64. Hepatitis C blood test. Hepatitis B blood test. Sexually transmitted disease (STD) testing. Diabetes screening. This is done by checking your blood sugar (glucose) after you have not eaten for a while (fasting). You may have this done every 1-3 years. Bone density scan. This is done to screen for osteoporosis. You may have this done starting at age 8. Mammogram. This may be done every 1-2 years. Talk to your health care provider about how often you should have regular mammograms. Talk with your health care provider about your test results, treatment options, and if necessary, the need for more tests. Vaccines  Your health care provider may recommend certain vaccines, such as: Influenza vaccine. This is recommended every year. Tetanus, diphtheria, and acellular pertussis (Tdap, Td) vaccine. You may need a Td booster every 10 years. Zoster vaccine. You may need this after age 70. Pneumococcal 13-valent conjugate (PCV13) vaccine. One dose is recommended after age 70. Pneumococcal polysaccharide (PPSV23) vaccine. One dose is recommended after age  28. Talk to your health care provider about which screenings and vaccines you need and how often you need  them. This information is not intended to replace advice given to you by your health care provider. Make sure you discuss any questions you have with your health care provider. Document Released: 06/04/2015 Document Revised: 01/26/2016 Document Reviewed: 03/09/2015 Elsevier Interactive Patient Education  2017 [place] Prevention in the Home Falls can cause injuries. They can happen to people of all ages. There are many things you can do to make your home safe and to help prevent falls. What can I do on the outside of my home? Regularly fix the edges of walkways and driveways and fix any cracks. Remove anything that might make you trip as you walk through a door, such as a raised step or threshold. Trim any bushes or trees on the path to your home. Use bright outdoor lighting. Clear any walking paths of anything that might make someone trip, such as rocks or tools. Regularly check to see if handrails are loose or broken. Make sure that both sides of any steps have handrails. Any raised decks and porches should have guardrails on the edges. Have any leaves, snow, or ice cleared regularly. Use sand or salt on walking paths during winter. Clean up any spills in your garage right away. This includes oil or grease spills. What can I do in the bathroom? Use night lights. Install grab bars by the toilet and in the tub and shower. Do not use towel bars as grab bars. Use non-skid mats or decals in the tub or shower. If you need to sit down in the shower, use a plastic, non-slip stool. Keep the floor dry. Clean up any water that spills on the floor as soon as it happens. Remove soap buildup in the tub or shower regularly. Attach bath mats securely with double-sided non-slip rug tape. Do not have throw rugs and other things on the floor that can make you trip. What can I do in the bedroom? Use night lights. Make sure that you have a light by your bed that is easy to reach. Do not use  any sheets or blankets that are too big for your bed. They should not hang down onto the floor. Have a firm chair that has side arms. You can use this for support while you get dressed. Do not have throw rugs and other things on the floor that can make you trip. What can I do in the kitchen? Clean up any spills right away. Avoid walking on wet floors. Keep items that you use a lot in easy-to-reach places. If you need to reach something above you, use a strong step stool that has a grab bar. Keep electrical cords out of the way. Do not use floor polish or wax that makes floors slippery. If you must use wax, use non-skid floor wax. Do not have throw rugs and other things on the floor that can make you trip. What can I do with my stairs? Do not leave any items on the stairs. Make sure that there are handrails on both sides of the stairs and use them. Fix handrails that are broken or loose. Make sure that handrails are as long as the stairways. Check any carpeting to make sure that it is firmly attached to the stairs. Fix any carpet that is loose or worn. Avoid having throw rugs at the top or bottom of the stairs. If you do have  throw rugs, attach them to the floor with carpet tape. Make sure that you have a light switch at the top of the stairs and the bottom of the stairs. If you do not have them, ask someone to add them for you. What else can I do to help prevent falls? Wear shoes that: Do not have high heels. Have rubber bottoms. Are comfortable and fit you well. Are closed at the toe. Do not wear sandals. If you use a stepladder: Make sure that it is fully opened. Do not climb a closed stepladder. Make sure that both sides of the stepladder are locked into place. Ask someone to hold it for you, if possible. Clearly mark and make sure that you can see: Any grab bars or handrails. First and last steps. Where the edge of each step is. Use tools that help you move around (mobility aids)  if they are needed. These include: Canes. Walkers. Scooters. Crutches. Turn on the lights when you go into a dark area. Replace any light bulbs as soon as they burn out. Set up your furniture so you have a clear path. Avoid moving your furniture around. If any of your floors are uneven, fix them. If there are any pets around you, be aware of where they are. Review your medicines with your doctor. Some medicines can make you feel dizzy. This can increase your chance of falling. Ask your doctor what other things that you can do to help prevent falls. This information is not intended to replace advice given to you by your health care provider. Make sure you discuss any questions you have with your health care provider. Document Released: 03/04/2009 Document Revised: 10/14/2015 Document Reviewed: 06/12/2014 Elsevier Interactive Patient Education  2017 [employer].

## 2021-09-27 ENCOUNTER — Ambulatory Visit (INDEPENDENT_AMBULATORY_CARE_PROVIDER_SITE_OTHER): Payer: Medicare HMO | Admitting: Family

## 2021-09-27 ENCOUNTER — Encounter: Payer: Self-pay | Admitting: Family

## 2021-09-27 VITALS — BP 110/70 | HR 84 | Temp 97.5°F | Resp 16 | Ht 62.0 in

## 2021-09-27 DIAGNOSIS — B3731 Acute candidiasis of vulva and vagina: Secondary | ICD-10-CM | POA: Diagnosis not present

## 2021-09-27 DIAGNOSIS — R300 Dysuria: Secondary | ICD-10-CM

## 2021-09-27 DIAGNOSIS — H6123 Impacted cerumen, bilateral: Secondary | ICD-10-CM | POA: Diagnosis not present

## 2021-09-27 DIAGNOSIS — B370 Candidal stomatitis: Secondary | ICD-10-CM

## 2021-09-27 LAB — POCT URINALYSIS DIPSTICK
Bilirubin, UA: NEGATIVE
Glucose, UA: NEGATIVE
Ketones, UA: POSITIVE
Nitrite, UA: POSITIVE
Protein, UA: NEGATIVE
Spec Grav, UA: 1.02 (ref 1.010–1.025)
Urobilinogen, UA: NEGATIVE E.U./dL — AB
pH, UA: 6 (ref 5.0–8.0)

## 2021-09-27 MED ORDER — NYSTATIN 100000 UNIT/ML MT SUSP
5.0000 mL | Freq: Four times a day (QID) | OROMUCOSAL | 0 refills | Status: AC
Start: 2021-09-27 — End: 2021-10-02

## 2021-09-27 MED ORDER — MICONAZOLE NITRATE 2 % EX CREA: 1.0000 "application " | TOPICAL_CREAM | Freq: Two times a day (BID) | CUTANEOUS | 0 refills | Status: DC

## 2021-09-27 MED ORDER — MICONAZOLE NITRATE 2 % VA CREA: 1.0000 | TOPICAL_CREAM | Freq: Every day | VAGINAL | 0 refills | Status: DC

## 2021-09-27 MED ORDER — DEBROX 6.5 % OT SOLN: 5.0000 [drp] | Freq: Two times a day (BID) | OTIC | 0 refills | Status: AC

## 2021-09-27 NOTE — Progress Notes (Addendum)
Provider: Richarda Blade FNP-C  Mast, Man X, NP  Patient Care Team: Mast, Man X, NP as PCP - General (Internal Medicine)  Extended Emergency Contact Information Primary Emergency Contact: Milbert Coulter Address: [address]          [place], [place] [address] Darden Amber of [place] Home Phone: [phone] Mobile Phone: [phone] Relation: Son Secondary Emergency Contact: Desma Paganini Address: [address]          [place], [place] [address] D[place] of [place] Phone: [phone] Relation: Son  Code Status:  DNR Goals of care: Advanced Directive information    09/27/2021   10:50 AM  Advanced Directives  Does Patient Have a Medical Advance Directive? Yes  Type of Estate agent of [place];Living will  Does patient want to make changes to medical advance directive? No - Patient declined  Copy of Healthcare Power of Attorney in Chart? Yes - validated most recent copy scanned in chart (See row information)     Chief Complaint  Patient presents with   Acute Visit    [facility] patient complains of yeast infection vaginally and oral X 2 weeks.    HPI:  Pt is a 81 y.o. female seen today for an acute visit for evaluation of vaginal discharge and tongue yeast infection x 2 weeks.Has used chlorhexidine mouth wash orally without any relief. Has had vaginal itch with greenish discharge.Has used Monistat vaginal cream for the past 3 days which seems to have helped with the symptoms but not resolved. She denies any abdominal pain, vaginal bleeding or pain.   Past Medical History:  Diagnosis Date   Arthritis    Chronic constipation    CKD (chronic kidney disease)    Gallstones    History of kidney stones    Interstitial cystitis    Thyroid disease    UTI (urinary tract infection)    Past Surgical History:  Procedure Laterality Date   CATARACT EXTRACTION Bilateral    CHOLECYSTECTOMY     EYE SURGERY Bilateral    Cat Sx   PARTIAL HYSTERECTOMY      TUBAL LIGATION      Allergies  Allergen Reactions   Ciprofloxacin    Diflucan [Fluconazole]    Sulfa Antibiotics     Outpatient Encounter Medications as of 09/27/2021  Medication Sig   aspirin EC 81 MG tablet Take 81 mg by mouth daily.   calcium carbonate (OSCAL) 1500 (600 Ca) MG TABS tablet Take 600 mg of elemental calcium by mouth daily with breakfast.   Cholecalciferol (EQL VITAMIN D3) 50 MCG (2000 UT) CAPS Take 2,000 Units by mouth daily.   estradiol (ESTRACE) 1 MG tablet INSERT 1/2 TABLET VAGINALLY THREE TIMES PER WEEK.   Glucosamine-Chondroit-Vit C-Mn (GLUCOSAMINE 1500 COMPLEX PO) Take 1,500 mg by mouth daily.   levothyroxine (SYNTHROID) 75 MCG tablet TAKE 1/2 TABLET (37.5 MCG TOTAL) BY MOUTH DAILY BEFORE BREAKFAST.   miconazole (MONISTAT 7) 2 % vaginal cream Place 1 Applicatorful vaginally at bedtime.   Multiple Vitamin (MULTIVITAMIN ADULT PO) Take 20 mg by mouth.   multivitamin-lutein (OCUVITE-LUTEIN) CAPS capsule Take 1 capsule by mouth daily.   nystatin (MYCOSTATIN) 100000 UNIT/ML suspension Take 5 mLs (500,000 Units total) by mouth 4 (four) times daily for 5 days.   Omega-3 Fatty Acids (FISH OIL PO) Take 2,000 mg by mouth daily.   polyethylene glycol (MIRALAX / GLYCOLAX) 17 g packet Take 17 g by mouth. Every other night   [DISCONTINUED] miconazole (MICOTIN) 2 % cream Apply  1 application. topically 2 (two) times daily.   No facility-administered encounter medications on file as of 09/27/2021.    Review of Systems  Constitutional:  Negative for chills, fatigue and fever.  Gastrointestinal:  Negative for abdominal pain, nausea and vomiting.  Genitourinary:  Positive for dysuria and vaginal discharge. Negative for difficulty urinating, flank pain, frequency, urgency, vaginal bleeding and vaginal pain.  Skin:  Negative for color change, pallor and rash.   Immunization History  Administered Date(s) Administered   Fluad Quad(high Dose 65+) 03/07/2021   Influenza, High Dose  Seasonal PF 02/20/2019, 03/03/2020   Moderna SARS-COV2 Booster Vaccination 03/30/2020, 10/19/2020   Moderna Sars-Covid-2 Vaccination 05/26/2019, 06/23/2019   Pfizer Covid-19 Vaccine Bivalent Booster 40yrs & up 02/08/2021   Pneumococcal Conjugate-13 05/22/2013   Pneumococcal Polysaccharide-23 05/22/2014   Tdap 05/22/2020   Zoster, Live 10/20/2009   Pertinent  Health Maintenance Due  Topic Date Due   INFLUENZA VACCINE  12/20/2021   DEXA SCAN  Completed      09/30/2020    1:35 PM 12/02/2020    2:11 PM 06/09/2021    2:35 PM 07/26/2021    8:57 AM 09/27/2021   10:49 AM  Fall Risk  Falls in the past year? 0 0 0 0 0  Was there an injury with Fall? 0 0 0 0 0  Fall Risk Category Calculator 0 0 0 0 0  Fall Risk Category Low Low Low Low Low  Patient Fall Risk Level Low fall risk Low fall risk Low fall risk Low fall risk Low fall risk  Patient at Risk for Falls Due to  No Fall Risks No Fall Risks No Fall Risks No Fall Risks  Fall risk Follow up  Falls evaluation completed Falls evaluation completed Falls evaluation completed Falls evaluation completed   Functional Status Survey:    Vitals:   09/27/21 1041  BP: 110/70  Pulse: 84  Resp: 16  Temp: (!) 97.5 F (36.4 C)  SpO2: 96%  Height:  (1.575 m)   Body mass index is 25.97 kg/m. Physical Exam Exam conducted with a chaperone present [facility] Dillard ,CMA).  Constitutional:      General: She is not in acute distress.    Appearance: She is not ill-appearing.  HENT:     Head: Normocephalic.     Right Ear: There is impacted cerumen.     Left Ear: There is impacted cerumen.     Nose: Nose normal. No congestion or rhinorrhea.     Mouth/Throat:     Mouth: Mucous membranes are moist.     Pharynx: Oropharynx is clear.     Comments: Posterior oropharynx whitish coating and left inner buccal erythema noted. Eyes:     General: No scleral icterus.       Right eye: No discharge.        Left eye: No discharge.     Conjunctiva/sclera:  Conjunctivae normal.     Pupils: Pupils are equal, round, and reactive to light.  Cardiovascular:     Rate and Rhythm: Normal rate and regular rhythm.     Pulses: Normal pulses.     Heart sounds: Normal heart sounds. No murmur heard.   No friction rub. No gallop.  Pulmonary:     Effort: Pulmonary effort is normal. No respiratory distress.     Breath sounds: Normal breath sounds. No wheezing, rhonchi or rales.  Chest:     Chest wall: No tenderness.  Abdominal:     General: Bowel sounds are  normal. There is no distension.     Palpations: Abdomen is soft. There is no mass.     Tenderness: There is no abdominal tenderness. There is no guarding.     Hernia: There is no hernia in the left inguinal area or right inguinal area.  Genitourinary:    Exam position: Lithotomy position.     Labia:        Right: No rash, tenderness or lesion.        Left: No rash, tenderness or lesion.      Vagina: Vaginal discharge present. No erythema, tenderness, bleeding or prolapsed vaginal walls.  Musculoskeletal:     Cervical back: Normal range of motion. No rigidity or tenderness.  Lymphadenopathy:     Cervical: No cervical adenopathy.     Lower Body: No right inguinal adenopathy. No left inguinal adenopathy.  Skin:    General: Skin is warm and dry.     Coloration: Skin is not pale.     Findings: No bruising, erythema, lesion or rash.  Neurological:     Mental Status: She is alert.  Psychiatric:        Mood and Affect: Mood normal.        Behavior: Behavior normal.    Labs reviewed: Recent Labs    11/01/20 1043  NA 136  K 4.5  CL 102  CO2 24  GLUCOSE 92  BUN 13  CREATININE 0.72  CALCIUM 9.3   Recent Labs    11/01/20 1043  AST 22  ALT 24  BILITOT 0.2  PROT 6.4   Recent Labs    11/01/20 1043  WBC 4.3  NEUTROABS 2,335  HGB 13.3  HCT 39.9  MCV 94.1  PLT 258   Lab Results  Component Value Date   TSH 3.79 11/01/2020   No results found for: HGBA1C Lab Results  Component  Value Date   CHOL 183 11/01/2020   HDL 63 11/01/2020   LDLCALC 100 (H) 11/01/2020   TRIG 104 11/01/2020   CHOLHDL 2.9 11/01/2020    Significant Diagnostic Results in last 30 days:  No results found.  Assessment/Plan  1. Vulvovaginal candidiasis Vaginal discharge noted mixed with recent use Monistat cream also difficult to discern.  No tenderness noted. -We will obtain urine specimen - miconazole (MONISTAT 7) 2 % vaginal cream; Place 1 Applicatorful vaginally at bedtime.  Dispense: 45 g; Refill: 0 - POC Urinalysis Dipstick indicates yellow cloudy urine with trace of blood and positive for nitrites and 3+ large leukocytes.  Discussed with patient will send urine for culture and made aware results will be back in 2 to 3 days. -Notify provider if symptoms worsen - Urine Culture  2. Oropharyngeal candidiasis Posterior oropharynx whitish coating and left inner buccal erythema noted. -We will prescribe nystatin advised to swish and spit or swish and swallow as below -Notify provider if symptoms worsen or not resolved - nystatin (MYCOSTATIN) 100000 UNIT/ML suspension; Take 5 mLs (500,000 Units total) by mouth 4 (four) times daily for 5 days.  Dispense: 60 mL; Refill: 0  3. Bilateral impacted cerumen TM not visualized due to cerumen impaction Advised to instill Debrox as below then schedule for bilateral ear lavage he had office at the facility clinic. - carbamide peroxide (DEBROX) 6.5 % OTIC solution; Place 5 drops into both ears 2 (two) times daily for 4 days.  Dispense: 2 mL; Refill: 0  Family/ staff Communication: Reviewed plan of care with patient verbalized understanding  Labs/tests ordered:  - Urine  Culture - POC Urinalysis Dipstick  Next Appointment: Schedule appointment in 1 week for bilateral ear lavage here at PCP office or at the facility clinic.  Caesar Bookman, NP

## 2021-09-27 NOTE — Patient Instructions (Signed)

## 2021-09-28 LAB — URINE CULTURE
MICRO NUMBER:: 13372744
SPECIMEN QUALITY:: ADEQUATE

## 2021-10-04 ENCOUNTER — Encounter: Payer: Self-pay | Admitting: Internal Medicine

## 2021-10-05 ENCOUNTER — Non-Acute Institutional Stay: Payer: Medicare HMO | Admitting: Internal Medicine

## 2021-10-05 DIAGNOSIS — H6122 Impacted cerumen, left ear: Secondary | ICD-10-CM | POA: Diagnosis not present

## 2021-10-05 DIAGNOSIS — H6121 Impacted cerumen, right ear: Secondary | ICD-10-CM

## 2021-10-09 NOTE — Progress Notes (Signed)
[facility]: [facility]:  Clinic (12)  Provider:   Code Status:  Goals of Care:     09/27/2021   10:50 AM  Advanced Directives  Does Patient Have a Medical Advance Directive? Yes  Type of Estate agent of [place];Living will  Does patient want to make changes to medical advance directive? No - Patient declined  Copy of Healthcare Power of Attorney in Chart? Yes - validated most recent copy scanned in chart (See row information)     Chief Complaint  Patient presents with   Acute Visit    Patient returns to the clinic for ear lavage.     HPI: Patient is a 81 y.o. female seen today for an acute visit for Ear lavage  C/o Both Ears Blocked   Past Medical History:  Diagnosis Date   Arthritis    Chronic constipation    CKD (chronic kidney disease)    Gallstones    History of kidney stones    Interstitial cystitis    Thyroid disease    UTI (urinary tract infection)     Past Surgical History:  Procedure Laterality Date   CATARACT EXTRACTION Bilateral    CHOLECYSTECTOMY     EYE SURGERY Bilateral    Cat Sx   PARTIAL HYSTERECTOMY     TUBAL LIGATION      Allergies  Allergen Reactions   Ciprofloxacin    Diflucan [Fluconazole]    Sulfa Antibiotics     Outpatient Encounter Medications as of 10/05/2021  Medication Sig   aspirin EC 81 MG tablet Take 81 mg by mouth daily.   calcium carbonate (OSCAL) 1500 (600 Ca) MG TABS tablet Take 600 mg of elemental calcium by mouth daily with breakfast.   Cholecalciferol (EQL VITAMIN D3) 50 MCG (2000 UT) CAPS Take 2,000 Units by mouth daily.   estradiol (ESTRACE) 1 MG tablet INSERT 1/2 TABLET VAGINALLY THREE TIMES PER WEEK.   Glucosamine-Chondroit-Vit C-Mn (GLUCOSAMINE 1500 COMPLEX PO) Take 1,500 mg by mouth daily.   levothyroxine (SYNTHROID) 75 MCG tablet TAKE 1/2 TABLET (37.5 MCG TOTAL) BY MOUTH DAILY BEFORE BREAKFAST.   miconazole (MONISTAT 7) 2 % vaginal cream Place 1 Applicatorful  vaginally at bedtime.   Multiple Vitamin (MULTIVITAMIN ADULT PO) Take 20 mg by mouth.   multivitamin-lutein (OCUVITE-LUTEIN) CAPS capsule Take 1 capsule by mouth daily.   Omega-3 Fatty Acids (FISH OIL PO) Take 2,000 mg by mouth daily.   polyethylene glycol (MIRALAX / GLYCOLAX) 17 g packet Take 17 g by mouth. Every other night   No facility-administered encounter medications on file as of 10/05/2021.    Review of Systems:  Review of Systems  Health Maintenance  Topic Date Due   Zoster Vaccines- Shingrix (1 of 2) 01/10/2022 (Originally 11/07/1990)   INFLUENZA VACCINE  12/20/2021   TETANUS/TDAP  05/22/2030   Pneumonia Vaccine 12+ Years old  Completed   DEXA SCAN  Completed   COVID-19 Vaccine  Completed   HPV VACCINES  Aged Out    Physical Exam: There were no vitals filed for this visit. There is no height or weight on file to calculate BMI. Physical Exam HENT:     Right Ear: Tympanic membrane normal.     Left Ear: Tympanic membrane normal.     Ears:     Comments: Cerumen in Right ear Left had no cerumen   Labs reviewed: Basic Metabolic Panel: Recent Labs    11/01/20 1043  NA 136  K 4.5  CL 102  CO2 24  GLUCOSE 92  BUN 13  CREATININE 0.72  CALCIUM 9.3  TSH 3.79   Liver Function Tests: Recent Labs    11/01/20 1043  AST 22  ALT 24  BILITOT 0.2  PROT 6.4   No results for input(s): LIPASE, AMYLASE in the last 8760 hours. No results for input(s): AMMONIA in the last 8760 hours. CBC: Recent Labs    11/01/20 1043  WBC 4.3  NEUTROABS 2,335  HGB 13.3  HCT 39.9  MCV 94.1  PLT 258   Lipid Panel: Recent Labs    11/01/20 1043  CHOL 183  HDL 63  LDLCALC 100*  TRIG 104  CHOLHDL 2.9   No results found for: HGBA1C  Procedures since last visit: No results found.  Assessment/Plan 1. Impacted cerumen of left ear No Treatment  2. Impacted cerumen of right ear Done  No Issues - Ear Lavage    Labs/tests ordered:  * No order type specified * Next  appt:  12/06/2021

## 2021-10-10 ENCOUNTER — Other Ambulatory Visit: Payer: Self-pay | Admitting: Nurse Practitioner

## 2021-10-10 NOTE — Progress Notes (Signed)
[facility] - Clinic Note  10/13/2021     CHIEF COMPLAINT Patient presents for Retina Follow Up    HISTORY OF PRESENT ILLNESS: Claudia Hunt is a 81 y.o. female who presents to the clinic today for:   HPI     Retina Follow Up   Patient presents with  Other.  In both eyes.  This started 9 months ago.  I, the attending physician,  performed the HPI with the patient and updated documentation appropriately.        Comments   Patient here for 9 months retina follow up for ERM OU. Patient states vision is pretty well. Has floaters. No eye pain.      Last edited by Rennis Chris, MD on 10/13/2021  2:16 PM.     Referring physician: Mast, Man X, NP [address] [place],  [place] [address]  HISTORICAL INFORMATION:   Selected notes from the MEDICAL RECORD NUMBER Referred by Dr. Chales Abrahams   CURRENT MEDICATIONS: No current outpatient medications on file. (Ophthalmic Drugs)   No current facility-administered medications for this visit. (Ophthalmic Drugs)   Current Outpatient Medications (Other)  Medication Sig   aspirin EC 81 MG tablet Take 81 mg by mouth daily.   calcium carbonate (OSCAL) 1500 (600 Ca) MG TABS tablet Take 600 mg of elemental calcium by mouth daily with breakfast.   Cholecalciferol (EQL VITAMIN D3) 50 MCG (2000 UT) CAPS Take 2,000 Units by mouth daily.   estradiol (ESTRACE) 1 MG tablet INSERT 1/2 (ONE-HALF) TABLET VAGINALLY THREE TIMES PER WEEK   Glucosamine-Chondroit-Vit C-Mn (GLUCOSAMINE 1500 COMPLEX PO) Take 1,500 mg by mouth daily.   levothyroxine (SYNTHROID) 75 MCG tablet TAKE 1/2 TABLET (37.5 MCG TOTAL) BY MOUTH DAILY BEFORE BREAKFAST.   miconazole (MONISTAT 7) 2 % vaginal cream Place 1 Applicatorful vaginally at bedtime.   Multiple Vitamin (MULTIVITAMIN ADULT PO) Take 20 mg by mouth.   multivitamin-lutein (OCUVITE-LUTEIN) CAPS capsule Take 1 capsule by mouth daily.   Omega-3 Fatty Acids (FISH OIL PO) Take 2,000 mg by mouth daily.    polyethylene glycol (MIRALAX / GLYCOLAX) 17 g packet Take 17 g by mouth. Every other night   No current facility-administered medications for this visit. (Other)   REVIEW OF SYSTEMS: ROS   Positive for: Gastrointestinal, Genitourinary, Musculoskeletal, Eyes Negative for: Constitutional, Neurological, Skin, HENT, Endocrine, Cardiovascular, Respiratory, Psychiatric, Allergic/Imm, Heme/Lymph Last edited by Laddie Aquas, COA on 10/13/2021  9:19 AM.     ALLERGIES Allergies  Allergen Reactions   Ciprofloxacin    Diflucan [Fluconazole]    Sulfa Antibiotics    PAST MEDICAL HISTORY Past Medical History:  Diagnosis Date   Arthritis    Chronic constipation    CKD (chronic kidney disease)    Gallstones    History of kidney stones    Interstitial cystitis    Thyroid disease    UTI (urinary tract infection)    Past Surgical History:  Procedure Laterality Date   CATARACT EXTRACTION Bilateral    CHOLECYSTECTOMY     EYE SURGERY Bilateral    Cat Sx   PARTIAL HYSTERECTOMY     TUBAL LIGATION     FAMILY HISTORY Family History  Problem Relation Age of Onset   Cancer Father    Diabetes Father    Prostate cancer Brother    Colon cancer Paternal Uncle    SOCIAL HISTORY Social History   Tobacco Use   Smoking status: Never   Smokeless tobacco: Never  Vaping Use  Vaping Use: Never used  Substance Use Topics   Alcohol use: Yes    Alcohol/week: 2.0 standard drinks    Types: 2 Glasses of wine per week    Comment: weekly   Drug use: Never       OPHTHALMIC EXAM:  Base Eye Exam     Visual Acuity (Snellen - Linear)       Right Left   Dist Delta 20/20 20/25 -1   Dist ph [place]  20/20         Tonometry (Tonopen, 9:17 AM)       Right Left   Pressure 15 14         Pupils       Dark Light Shape React APD   Right 2 1 Round Minimal None   Left 2 1 Round Minimal None         Visual Fields (Counting fingers)       Left Right    Full Full         Extraocular  Movement       Right Left    Full, Ortho Full, Ortho         Neuro/Psych     Oriented x3: Yes   Mood/Affect: Normal         Dilation     Both eyes: 1.0% Mydriacyl, 2.5% Phenylephrine @ 9:17 AM           Slit Lamp and Fundus Exam     Slit Lamp Exam       Right Left   Lids/Lashes Dermatochalasis, mild MGD Dermatochalasis, mild Meibomian gland dysfunction   Conjunctiva/Sclera white and quiet white and quiet   Cornea mild arcus, trace Punctate epithelial erosions mild arcus, trace Punctate epithelial erosions, mild Debris in tear film   Anterior Chamber deep and clear deep and clear   Iris round--moderate dilation to 5.25 mm round--moderate dilation 5.75 mm   Lens well centered PCIOL, 1+PCO noncentral, nasal well centered PCIOL, good position   Anterior Vitreous syneresis, PVD, vitreous condensations inferiorly syneresis, Posterior vitreous detachment, vitreous condensations inferiorly         Fundus Exam       Right Left   Disc mild pallor, sharp rim, +PPA mild pallor, sharp rim, mild temporal PPA   C/D Ratio 0.1 0.2   Macula Flat, blunted foveal reflex, mild RPE mottling and clumping, mlld ERM with trace cystic changes -- stable, no heme Flat, blunted foveal reflex, mild RPE mottling and clumping, mild ERM with trace cystic changes -- stable, no heme   Vessels mild attenuation, tortuousity mild attenuation, tortuousity   Periphery Attached, mild reticular degeneration, no heme attached, no heme           IMAGING AND PROCEDURES  Imaging and Procedures for 10/13/2021  OCT, Retina - OU - Both Eyes       Right Eye Quality was borderline. Central Foveal Thickness: 281. Progression has been stable. Findings include epiretinal membrane, vitreomacular adhesion , no SRF, intraretinal fluid, abnormal foveal contour (Persistent ERM with cystic changes temporal fovea -- ?development of lamella hole ).   Left Eye Quality was borderline. Central Foveal Thickness: 345.  Progression has been stable. Findings include abnormal foveal contour, intraretinal fluid, no SRF, epiretinal membrane.   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 CME -  cystoid macular edema. PED - pigment epithelial detachment. IRF - intraretinal fluid. SRF - subretinal fluid. EZ - ellipsoid zone. ERM - epiretinal membrane. ORA - outer retinal atrophy. ORT - outer retinal tubulation. SRHM - subretinal hyper-reflective material. IRHM - intraretinal hyper-reflective material            ASSESSMENT/PLAN:    ICD-10-CM   1. 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 Essential hypertension  I10     4. Hypertensive retinopathy of both eyes  H35.033     5. Pseudophakia of both eyes  Z96.1      1-3. Epiretinal membrane with mild foveoschisis OU (OS>OD) -- stable - The natural history, anatomy, potential for loss of vision, and treatment options including vitrectomy techniques and the complications of endophthalmitis, retinal detachment, vitreous hemorrhage, cataract progression and permanent vision loss discussed with the patient. 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 Hypertensive retinopathy OU - discussed importance of tight BP control - monitor   6. Pseudophakia OU  - s/p CE/IOL OU (in [place])  - IOLs in good position, doing well  - monitor   Ophthalmic Meds Ordered this visit:  No  orders of the defined types were placed in this encounter.  This document serves as a record of services personally performed by Karie Chimera, MD, PhD. It was created on their behalf by Glee Arvin. Manson Passey, OA an ophthalmic technician. The creation of this record is the provider's dictation and/or activities during the visit.    Electronically signed by: Glee Arvin. [place], [place] 05.22.2023 2:17 PM  Karie Chimera, M.D., Ph.D. Diseases & Surgery of the Retina and Vitreous [facility]  I have reviewed the above documentation for accuracy and completeness, and I agree with the above. Karie Chimera, M.D., Ph.D. 10/13/21 2:18 PM  Abbreviations: M myopia (nearsighted); A astigmatism; H hyperopia (farsighted); P presbyopia; Mrx spectacle prescription;  CTL contact lenses; OD right eye; OS left eye; OU both eyes  XT exotropia; ET esotropia; PEK punctate epithelial keratitis; PEE punctate epithelial erosions; DES dry eye syndrome; MGD meibomian gland dysfunction; ATs artificial tears; PFAT's preservative free artificial tears; NSC nuclear sclerotic cataract; PSC posterior subcapsular cataract; ERM epi-retinal membrane; PVD posterior vitreous detachment; RD retinal detachment; DM diabetes mellitus; DR diabetic retinopathy; NPDR non-proliferative diabetic retinopathy; PDR proliferative diabetic retinopathy; CSME clinically significant macular edema; DME diabetic macular edema; dbh dot blot hemorrhages; CWS cotton wool spot; POAG primary open angle glaucoma; C/D cup-to-disc ratio; HVF humphrey visual field; GVF goldmann visual field; OCT optical coherence tomography; IOP intraocular pressure; BRVO Branch retinal vein occlusion; CRVO central retinal vein occlusion; CRAO central retinal artery occlusion; BRAO branch retinal artery occlusion; RT retinal tear; SB scleral buckle; PPV pars plana vitrectomy; VH Vitreous hemorrhage; PRP panretinal laser photocoagulation; IVK intravitreal kenalog; VMT  vitreomacular traction; MH Macular hole;  NVD neovascularization of the disc; NVE neovascularization elsewhere; AREDS age related eye disease study; ARMD age related macular degeneration; POAG primary  open angle glaucoma; EBMD epithelial/anterior basement membrane dystrophy; ACIOL anterior chamber intraocular lens; IOL intraocular lens; PCIOL posterior chamber intraocular lens; Phaco/IOL phacoemulsification with intraocular lens placement; PRK photorefractive keratectomy; LASIK laser assisted in situ keratomileusis; HTN hypertension; DM diabetes mellitus; COPD chronic obstructive pulmonary disease  

## 2021-10-13 ENCOUNTER — Encounter (INDEPENDENT_AMBULATORY_CARE_PROVIDER_SITE_OTHER): Payer: Self-pay | Admitting: Ophthalmology

## 2021-10-13 ENCOUNTER — Ambulatory Visit (INDEPENDENT_AMBULATORY_CARE_PROVIDER_SITE_OTHER): Payer: Medicare HMO | Admitting: Ophthalmology

## 2021-10-13 DIAGNOSIS — H33193 Other retinoschisis and retinal cysts, bilateral: Secondary | ICD-10-CM | POA: Diagnosis not present

## 2021-10-13 DIAGNOSIS — I10 Essential (primary) hypertension: Secondary | ICD-10-CM

## 2021-10-13 DIAGNOSIS — H35033 Hypertensive retinopathy, bilateral: Secondary | ICD-10-CM

## 2021-10-13 DIAGNOSIS — H35373 Puckering of macula, bilateral: Secondary | ICD-10-CM | POA: Diagnosis not present

## 2021-10-13 DIAGNOSIS — Z961 Presence of intraocular lens: Secondary | ICD-10-CM

## 2021-10-15 IMAGING — MG DIGITAL SCREENING BILAT W/ TOMO W/ CAD
6 of 10 series · 6 of 30 positions shown · non-contrast
Comparison: Previous exam(s).

CLINICAL DATA: Screening.

EXAM:
DIGITAL SCREENING BILATERAL MAMMOGRAM WITH TOMO AND CAD

[L MLO synth-2D]
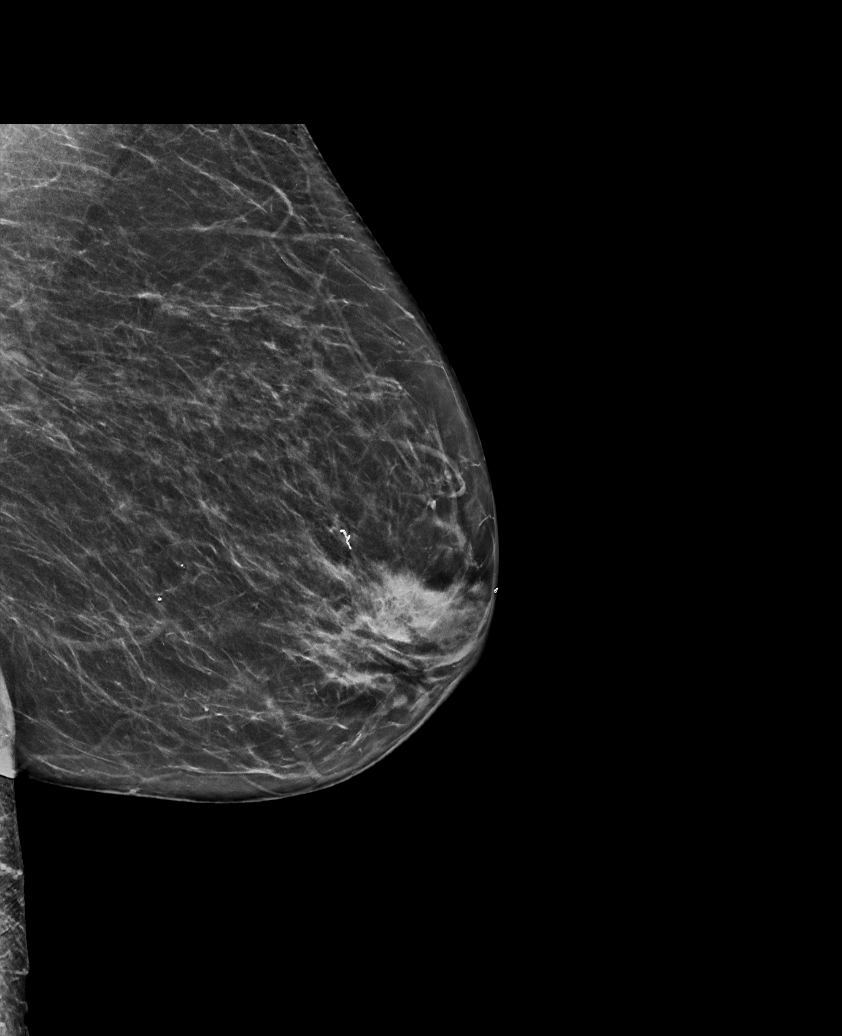

[R CC synth-2D]
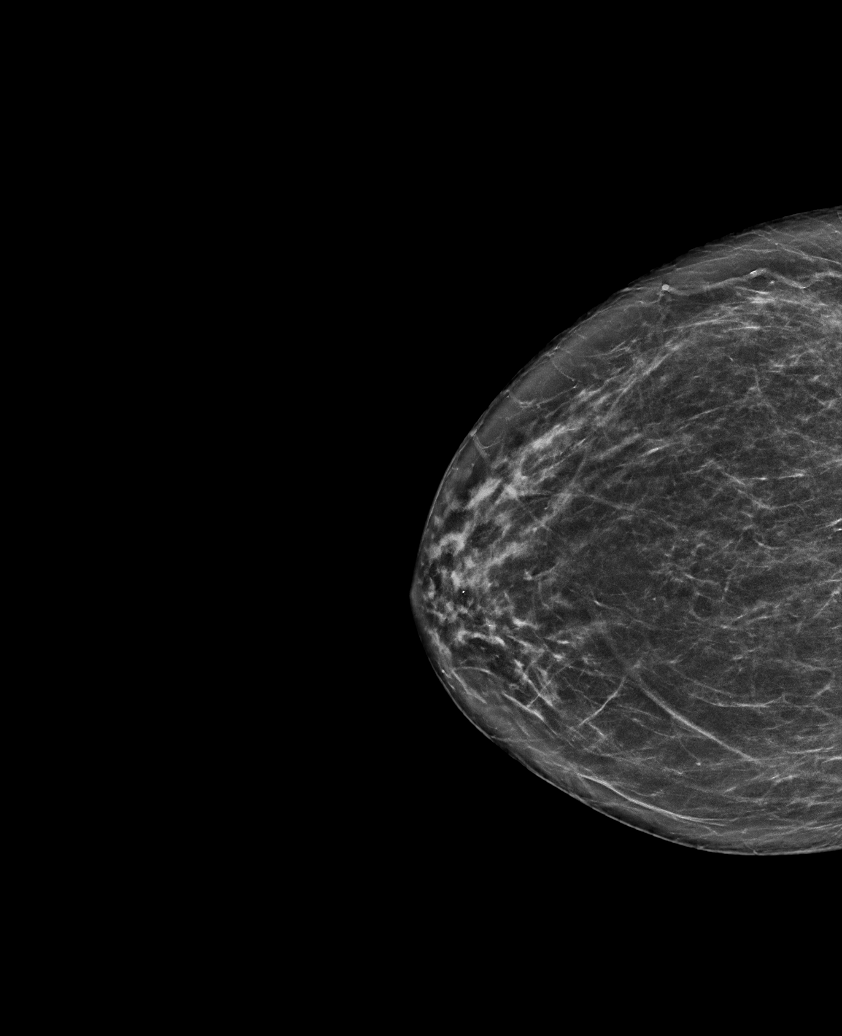

[R MLO synth-2D (1 of 2)]
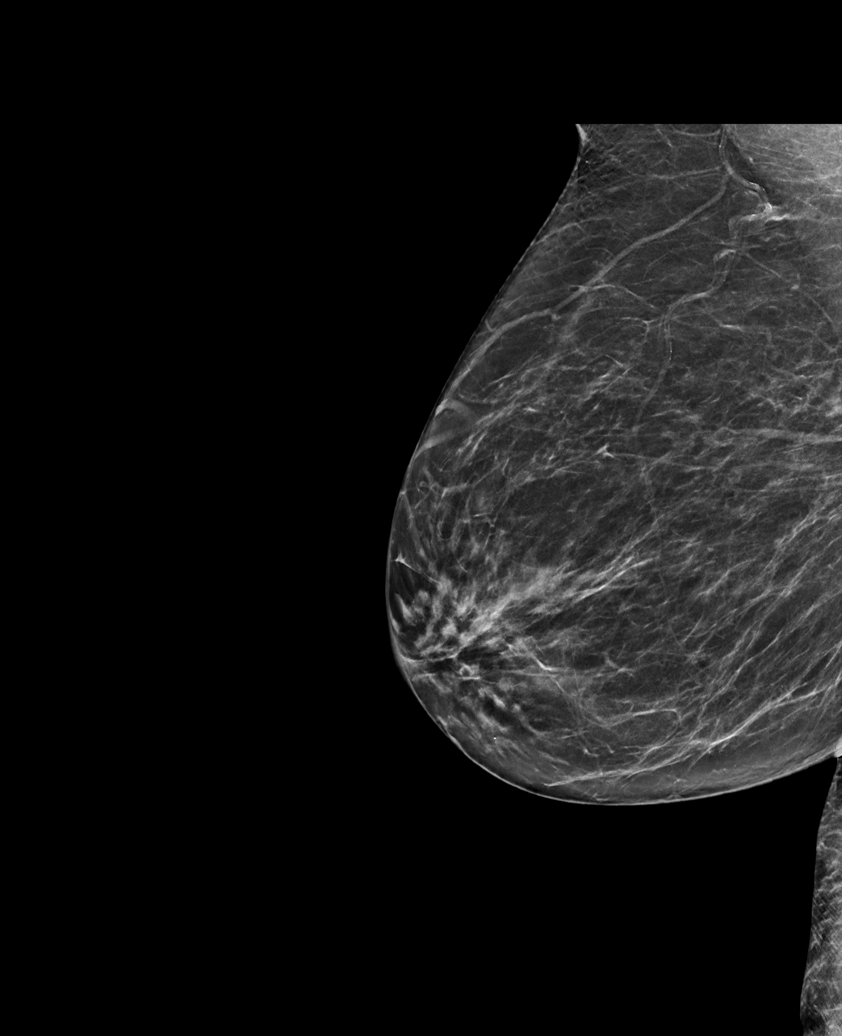

[L CC synth-2D]
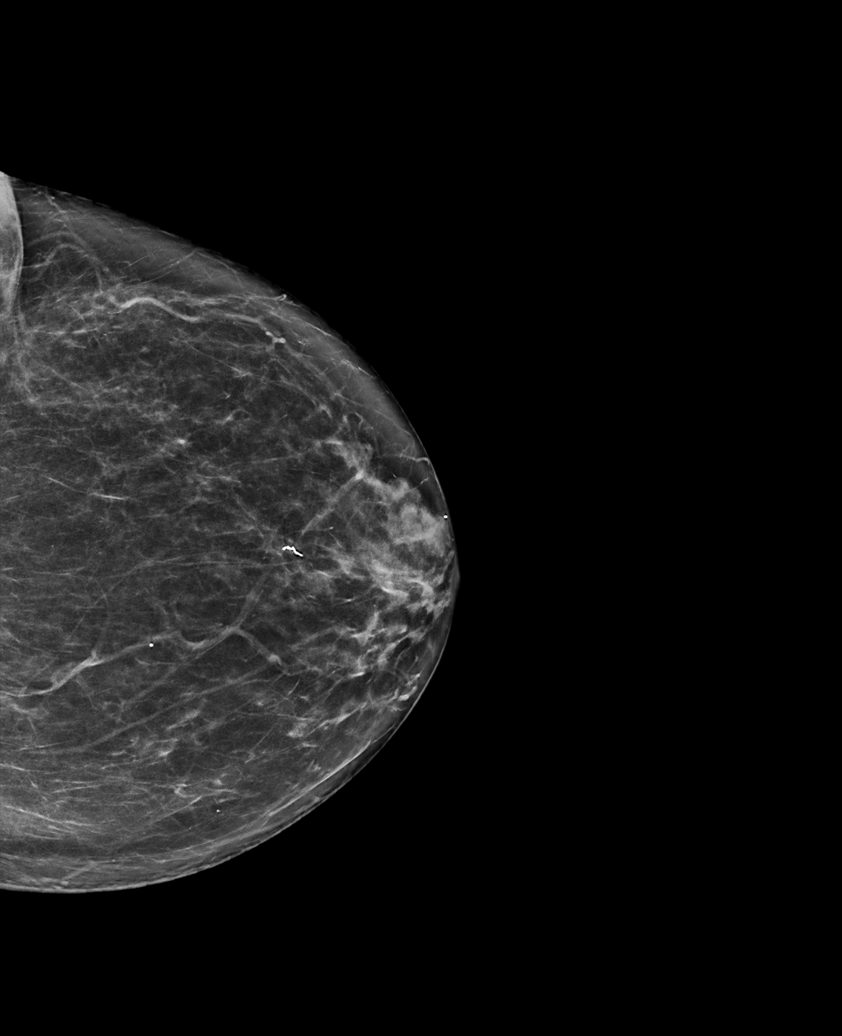

[R MLO synth-2D (2 of 2)]
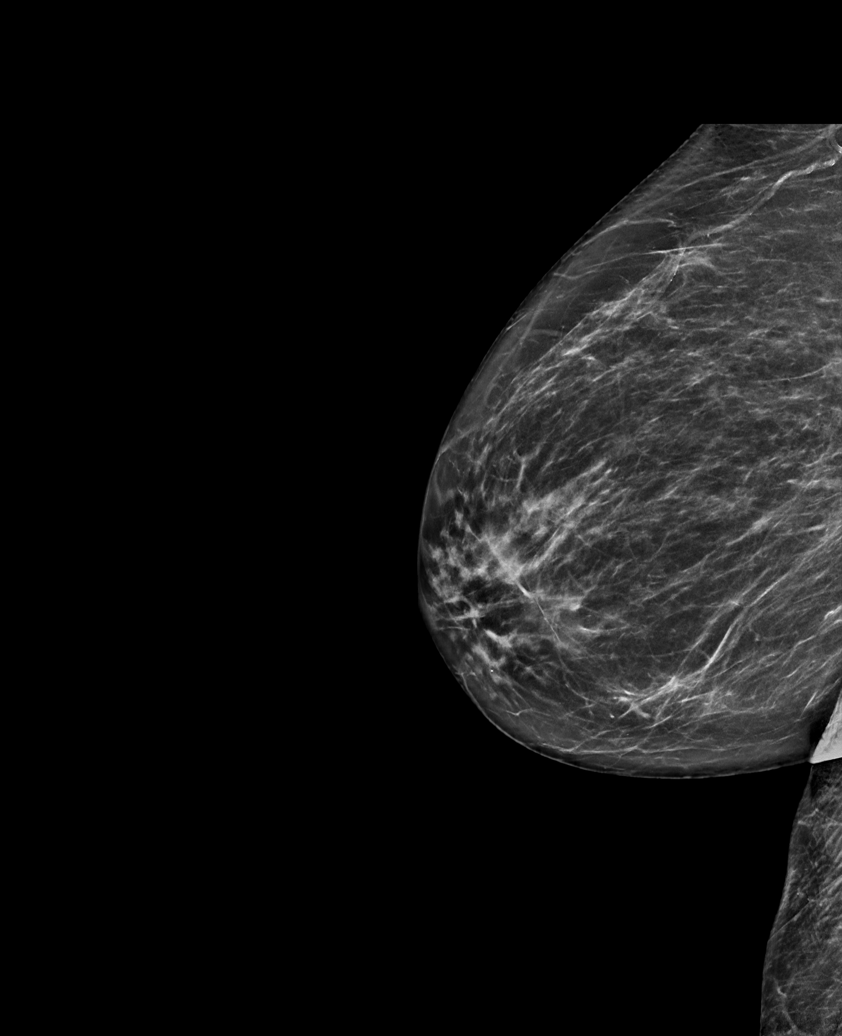

[L CC tomo · tomo slice 35/69.0]
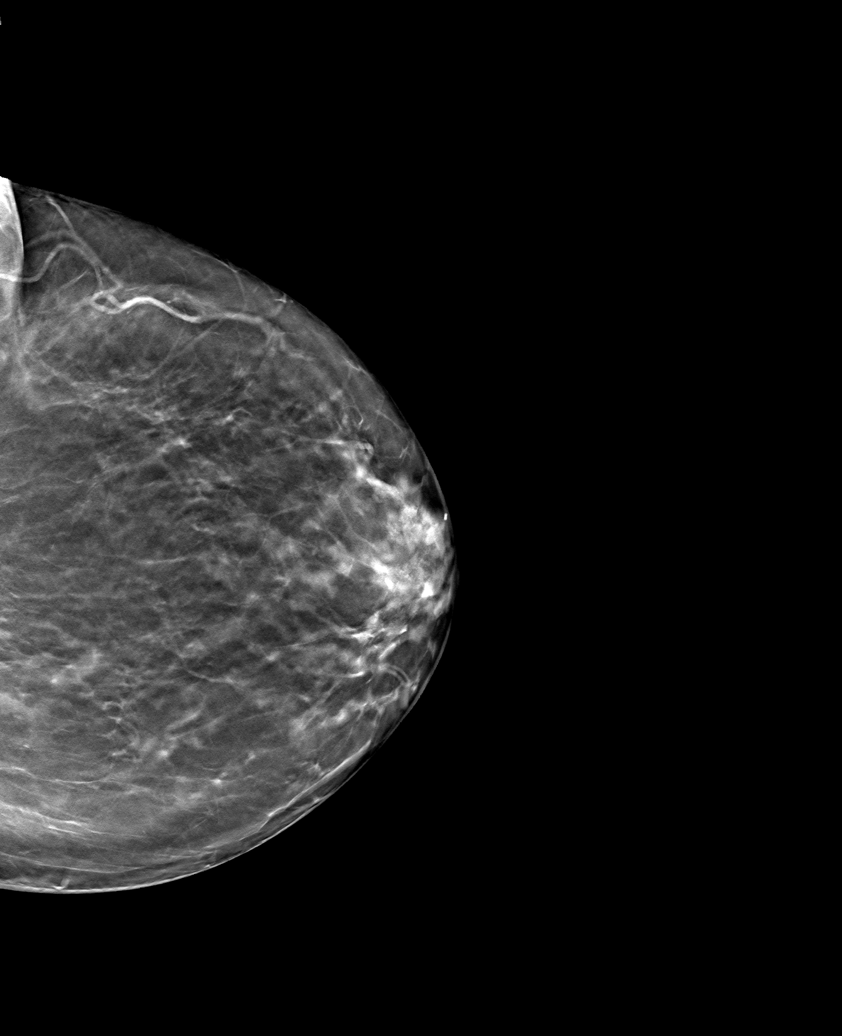

[6 of 30 positions shown; findings below may reference images not displayed]

ACR Breast Density Category b: There are scattered areas of
fibroglandular density.
FINDINGS: There are no findings suspicious for malignancy. Images were
processed with CAD.
IMPRESSION: No mammographic evidence of malignancy. A result letter of this
screening mammogram will be mailed directly to the patient.

RECOMMENDATION:
Screening mammogram in one year. (Code:[id number])

BI-RADS CATEGORY  1: Negative.

## 2021-12-06 ENCOUNTER — Other Ambulatory Visit: Payer: Medicare HMO

## 2021-12-06 DIAGNOSIS — E785 Hyperlipidemia, unspecified: Secondary | ICD-10-CM | POA: Diagnosis not present

## 2021-12-06 DIAGNOSIS — E039 Hypothyroidism, unspecified: Secondary | ICD-10-CM

## 2021-12-06 LAB — LIPID PANEL
Cholesterol: 189 mg/dL (ref <200)
HDL: 60 mg/dL (ref >=50)
LDL Cholesterol (Calc): 104 mg/dL (calc) — ABNORMAL HIGH
Non-HDL Cholesterol (Calc): 129 mg/dL (calc) (ref <130)
Total CHOL/HDL Ratio: 3.2 (calc) (ref <5.0)
Triglycerides: 149 mg/dL (ref <150)

## 2021-12-06 LAB — CBC WITH DIFFERENTIAL/PLATELET
Absolute Monocytes: 403 cells/uL (ref 200–950)
Basophils Absolute: 38 cells/uL (ref 0–200)
Basophils Relative: 0.8 %
Eosinophils Absolute: 130 cells/uL (ref 15–500)
Eosinophils Relative: 2.7 %
HCT: 41.2 % (ref 35.0–45.0)
Hemoglobin: 13.9 g/dL (ref 11.7–15.5)
Lymphs Abs: 1291 cells/uL (ref 850–3900)
MCH: 31 pg (ref 27.0–33.0)
MCHC: 33.7 g/dL (ref 32.0–36.0)
MCV: 92 fL (ref 80.0–100.0)
MPV: 9.8 fL (ref 7.5–12.5)
Monocytes Relative: 8.4 %
Neutro Abs: 2938 cells/uL (ref 1500–7800)
Neutrophils Relative %: 61.2 %
Platelets: 252 10*3/uL (ref 140–400)
RBC: 4.48 10*6/uL (ref 3.80–5.10)
RDW: 12.2 % (ref 11.0–15.0)
Total Lymphocyte: 26.9 %
WBC: 4.8 10*3/uL (ref 3.8–10.8)

## 2021-12-06 LAB — COMPLETE METABOLIC PANEL WITH GFR
AG Ratio: 1.8 (calc) (ref 1.0–2.5)
ALT: 21 U/L (ref 6–29)
AST: 21 U/L (ref 10–35)
Albumin: 4.2 g/dL (ref 3.6–5.1)
Alkaline phosphatase (APISO): 68 U/L (ref 37–153)
BUN: 10 mg/dL (ref 7–25)
CO2: 26 mmol/L (ref 20–32)
Calcium: 9.1 mg/dL (ref 8.6–10.4)
Chloride: 103 mmol/L (ref 98–110)
Creat: 0.75 mg/dL (ref 0.60–0.95)
Globulin: 2.3 g/dL (calc) (ref 1.9–3.7)
Glucose, Bld: 92 mg/dL (ref 65–99)
Potassium: 4.5 mmol/L (ref 3.5–5.3)
Sodium: 137 mmol/L (ref 135–146)
Total Bilirubin: 0.3 mg/dL (ref 0.2–1.2)
Total Protein: 6.5 g/dL (ref 6.1–8.1)
eGFR: 79 mL/min/{1.73_m2} (ref >=60)

## 2021-12-06 LAB — TSH: TSH: 4.13 mIU/L (ref 0.40–4.50)

## 2021-12-08 ENCOUNTER — Encounter: Payer: PRIVATE HEALTH INSURANCE | Admitting: Nurse Practitioner

## 2021-12-15 ENCOUNTER — Non-Acute Institutional Stay: Payer: Medicare HMO | Admitting: Nurse Practitioner

## 2021-12-15 ENCOUNTER — Encounter: Payer: Self-pay | Admitting: Nurse Practitioner

## 2021-12-15 DIAGNOSIS — E559 Vitamin D deficiency, unspecified: Secondary | ICD-10-CM

## 2021-12-15 DIAGNOSIS — K5901 Slow transit constipation: Secondary | ICD-10-CM | POA: Diagnosis not present

## 2021-12-15 DIAGNOSIS — E039 Hypothyroidism, unspecified: Secondary | ICD-10-CM

## 2021-12-15 DIAGNOSIS — N952 Postmenopausal atrophic vaginitis: Secondary | ICD-10-CM | POA: Diagnosis not present

## 2021-12-15 DIAGNOSIS — K219 Gastro-esophageal reflux disease without esophagitis: Secondary | ICD-10-CM | POA: Diagnosis not present

## 2021-12-15 DIAGNOSIS — E785 Hyperlipidemia, unspecified: Secondary | ICD-10-CM | POA: Diagnosis not present

## 2021-12-15 MED ORDER — ESTRADIOL 1 MG PO TABS: ORAL_TABLET | ORAL | 3 refills | Status: DC

## 2021-12-15 NOTE — Assessment & Plan Note (Signed)
takes D, Vit D level 40 

## 2021-12-15 NOTE — Assessment & Plan Note (Signed)
on Levothyroxine 75mcg qd, TSH 4.13 12/06/21 

## 2021-12-15 NOTE — Assessment & Plan Note (Signed)
managed with Estradiol 0.25mg  qd.

## 2021-12-15 NOTE — Progress Notes (Signed)
Location:   clinic [facility]   Place of Service:  Clinic (12) Provider: Chipper Oman NP  Code Status: DNR Goals of Care: [place]    12/15/2021   10:47 AM  Advanced Directives  Does Patient Have a Medical Advance Directive? Yes  Type of Advance Directive Out of facility DNR (pink MOST or yellow form);Living will;Healthcare Power of Attorney  Does patient want to make changes to medical advance directive? No - Patient declined  Copy of Healthcare Power of Attorney in Chart? Yes - validated most recent copy scanned in chart (See row information)  Pre-existing out of facility DNR order (yellow form or pink MOST form) Pink MOST form placed in chart (order not valid for inpatient use)     Chief Complaint  Patient presents with   Medical Management of Chronic Issues    Patient is here for a follow up for chronic condition. Patient is past due on 2nd Shingrix,[facility] verified     HPI: Patient is a 81 y.o. female seen today for medical management of chronic diseases.      GERD, stable, off Famotidine 20mg  bid. Hgb 13.9 12/06/21             Postmenopausal symptoms, managed with Estradiol 0.25mg  qd.                 Hypothyroidism, on Levothyroxine 12/08/21 qd, TSH 4.13 12/06/21             Hyperlipidemia, takes Fish oil, ASA 81mg  qd, LDL 104 12/06/21             Constipation, MiraLax qod, some how floated. Linzess is too strong.              Vitamin D deficiency, takes D, Vit D level 40  Past Medical History:  Diagnosis Date   Arthritis    Chronic constipation    CKD (chronic kidney disease)    Gallstones    History of kidney stones    Interstitial cystitis    Thyroid disease    UTI (urinary tract infection)     Past Surgical History:  Procedure Laterality Date   CATARACT EXTRACTION Bilateral    CHOLECYSTECTOMY     EYE SURGERY Bilateral    Cat Sx   PARTIAL HYSTERECTOMY     TUBAL LIGATION      Allergies  Allergen Reactions   Ciprofloxacin    Diflucan [Fluconazole]    Sulfa Antibiotics      Allergies as of 12/15/2021       Reactions   Ciprofloxacin    Diflucan [fluconazole]    Sulfa Antibiotics         Medication List        Accurate as of December 15, 2021  5:16 PM. If you have any questions, ask your nurse or doctor.          aspirin EC 81 MG tablet Take 81 mg by mouth daily.   calcium carbonate 1500 (600 Ca) MG Tabs tablet Commonly known as: OSCAL Take 600 mg of elemental calcium by mouth daily with breakfast.   EQL Vitamin D3 50 MCG (2000 UT) Caps Generic drug: Cholecalciferol Take 2,000 Units by mouth daily.   estradiol 1 MG tablet Commonly known as: ESTRACE INSERT 1/2 (ONE-HALF) TABLET VAGINALLY THREE TIMES PER WEEK What changed: Another medication with the same name was added. Make sure you understand how and when to take each. Changed by: Sachi Boulay X Lynnann Knudsen, NP   estradiol 1 MG tablet Commonly  known as: ESTRACE One table vaginal 3 times a week. What changed: You were already taking a medication with the same name, and this prescription was added. Make sure you understand how and when to take each. Changed by: Lella Mullany X Derron Pipkins, NP   FISH OIL PO Take 2,000 mg by mouth daily.   GLUCOSAMINE 1500 COMPLEX PO Take 1,500 mg by mouth daily.   levothyroxine 75 MCG tablet Commonly known as: SYNTHROID TAKE 1/2 TABLET (37.5 MCG TOTAL) BY MOUTH DAILY BEFORE BREAKFAST.   miconazole 2 % vaginal cream Commonly known as: MONISTAT 7 Place 1 Applicatorful vaginally at bedtime.   MULTIVITAMIN ADULT PO Take 20 mg by mouth.   multivitamin-lutein Caps capsule Take 1 capsule by mouth daily.   polyethylene glycol 17 g packet Commonly known as: MIRALAX / GLYCOLAX Take 17 g by mouth. Every other night   Shingrix injection Generic drug: Zoster Vaccine Adjuvanted        Review of Systems:  Review of Systems  Constitutional:  Negative for activity change, appetite change and fever.  HENT:  Positive for hearing loss. Negative for congestion and voice change.    Eyes:  Negative for visual disturbance.  Respiratory:  Negative for cough and shortness of breath.   Cardiovascular:  Negative for leg swelling.  Gastrointestinal:  Negative for abdominal pain, constipation, nausea and vomiting.       Bloated after Laxatives.   Genitourinary:  Negative for difficulty urinating, dysuria, genital sores, urgency, vaginal bleeding, vaginal discharge and vaginal pain.       Urination x1/night. Vaginal irritation  Musculoskeletal:  Negative for gait problem.  Skin:  Negative for color change.  Neurological:  Negative for speech difficulty, weakness and headaches.  Psychiatric/Behavioral:  Negative for behavioral problems and sleep disturbance. The patient is not nervous/anxious.        5 hours average sleep at night.     Health Maintenance  Topic Date Due   Zoster Vaccines- Shingrix (2 of 2) 12/08/2021   INFLUENZA VACCINE  12/20/2021   TETANUS/TDAP  05/22/2030   Pneumonia Vaccine 30+ Years old  Completed   DEXA SCAN  Completed   COVID-19 Vaccine  Completed   HPV VACCINES  Aged Out    Physical Exam: Vitals:   12/15/21 1544  BP: 128/72  Pulse: 76  Resp: 18  Temp: (!) 97.5 F (36.4 C)  TempSrc: Temporal  SpO2: 99%  Weight: 142 lb (64.4 kg)  Height: 5\' 2"  (1.575 m)   Body mass index is 25.97 kg/m. Physical Exam Vitals and nursing note reviewed.  Constitutional:      Appearance: Normal appearance.     Comments: Over weight.   HENT:     Head: Normocephalic and atraumatic.     Mouth/Throat:     Mouth: Mucous membranes are moist.  Eyes:     Extraocular Movements: Extraocular movements intact.     Conjunctiva/sclera: Conjunctivae normal.     Pupils: Pupils are equal, round, and reactive to light.  Cardiovascular:     Rate and Rhythm: Normal rate and regular rhythm.     Heart sounds: No murmur heard. Pulmonary:     Effort: Pulmonary effort is normal.     Breath sounds: No rales.  Abdominal:     Palpations: Abdomen is soft.      Tenderness: There is no abdominal tenderness.     Comments: Shifting dullness on percussion.   Genitourinary:    Vagina: Vaginal discharge present.     Comments: Fishy smell  Musculoskeletal:     Cervical back: Normal range of motion and neck supple.     Right lower leg: No edema.     Left lower leg: No edema.  Skin:    General: Skin is warm and dry.  Neurological:     General: No focal deficit present.     Mental Status: She is alert and oriented to person, place, and time. Mental status is at baseline.     Gait: Gait normal.  Psychiatric:        Mood and Affect: Mood normal.        Behavior: Behavior normal.        Thought Content: Thought content normal.        Judgment: Judgment normal.   /  Labs reviewed: Basic Metabolic Panel: Recent Labs    12/06/21 0705  NA 137  K 4.5  CL 103  CO2 26  GLUCOSE 92  BUN 10  CREATININE 0.75  CALCIUM 9.1  TSH 4.13   Liver Function Tests: Recent Labs    12/06/21 0705  AST 21  ALT 21  BILITOT 0.3  PROT 6.5   No results for input(s): "LIPASE", "AMYLASE" in the last 8760 hours. No results for input(s): "AMMONIA" in the last 8760 hours. CBC: Recent Labs    12/06/21 0705  WBC 4.8  NEUTROABS 2,938  HGB 13.9  HCT 41.2  MCV 92.0  PLT 252   Lipid Panel: Recent Labs    12/06/21 0705  CHOL 189  HDL 60  LDLCALC 104*  TRIG 149  CHOLHDL 3.2   No results found for: "HGBA1C"  Procedures since last visit: No results found.  Assessment/Plan  GERD (gastroesophageal reflux disease)  stable, off Famotidine 20mg  bid. 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      Hypothyroidism  on Levothyroxine 12/08/21 qd, TSH 4.13 12/06/21  Hyperlipidemia takes Fish oil, ASA 81mg  qd, LDL 104 12/06/21  Slow transit constipation MiraLax qod, some how floated. Linzess is too strong.   Vitamin D deficiency  takes D, Vit D level 40   Labs/tests ordered:  none Next appt:  4 months.

## 2021-12-15 NOTE — Assessment & Plan Note (Signed)
MiraLax qod, some how floated. Linzess is too strong.

## 2021-12-15 NOTE — Assessment & Plan Note (Signed)
stable, off Famotidine 20mg  bid. Hgb 13.9 12/06/21

## 2021-12-15 NOTE — Assessment & Plan Note (Signed)
takes Fish oil, ASA 81mg qd, LDL 104 12/06/21 

## 2022-04-02 ENCOUNTER — Other Ambulatory Visit: Payer: Self-pay | Admitting: Nurse Practitioner

## 2022-04-03 NOTE — Telephone Encounter (Signed)
Pharmacy requested refill.  Pended Rx and sent to [facility] for approval due to HIGH ALERT Warning.  

## 2022-04-20 ENCOUNTER — Non-Acute Institutional Stay: Payer: Medicare HMO | Admitting: Nurse Practitioner

## 2022-04-20 ENCOUNTER — Other Ambulatory Visit: Payer: Self-pay | Admitting: Internal Medicine

## 2022-04-20 ENCOUNTER — Encounter: Payer: Self-pay | Admitting: Nurse Practitioner

## 2022-04-20 VITALS — BP 118/72 | HR 75 | Temp 97.5°F | Resp 18 | Ht 62.0 in | Wt 147.0 lb

## 2022-04-20 DIAGNOSIS — E039 Hypothyroidism, unspecified: Secondary | ICD-10-CM

## 2022-04-20 DIAGNOSIS — Z1231 Encounter for screening mammogram for malignant neoplasm of breast: Secondary | ICD-10-CM

## 2022-04-20 DIAGNOSIS — E559 Vitamin D deficiency, unspecified: Secondary | ICD-10-CM | POA: Diagnosis not present

## 2022-04-20 DIAGNOSIS — K5901 Slow transit constipation: Secondary | ICD-10-CM

## 2022-04-20 DIAGNOSIS — E785 Hyperlipidemia, unspecified: Secondary | ICD-10-CM | POA: Diagnosis not present

## 2022-04-20 NOTE — Assessment & Plan Note (Signed)
takes Fish oil, ASA 81mg  qd, LDL 104 12/06/21

## 2022-04-20 NOTE — Assessment & Plan Note (Signed)
on Levothyroxine qd, TSH 4.13 12/06/21

## 2022-04-20 NOTE — Assessment & Plan Note (Signed)
MiraLax qod, feels floated sometimes, Linzess is too strong.

## 2022-04-20 NOTE — Progress Notes (Signed)
Location:   clinic [facility]  Place of Service:  Clinic (12) Provider: Chipper Oman NP  Code Status: DNR [place]als of Care: [place]    04/20/2022   10:13 AM  Advanced Directives  Does Patient Have a Medical Advance Directive? Yes  Type of Advance Directive Living will;Out of facility DNR (pink MOST or yellow form)  Does patient want to make changes to medical advance directive? No - Patient declined     Chief Complaint  Patient presents with   Medical Management of Chronic Issues    Patient is here for a follow up for chronic conditions     HPI: Patient is a 81 y.o. female seen today for medical management of chronic diseases.     GERD, stable, off Famotidine 20mg  bid. Hgb 13.9 12/06/21             Postmenopausal symptoms, managed with Estradiol 0.25mg  qd.     Hypothyroidism, on Levothyroxine 12/08/21 qd, TSH 4.13 12/06/21             Hyperlipidemia, takes Fish oil, ASA 81mg  qd, LDL 104 12/06/21             Constipation, MiraLax qod, feels floated sometimes, Linzess is too strong.              Vitamin D deficiency, takes D, Vit D level 40  Past Medical History:  Diagnosis Date   Arthritis    Chronic constipation    CKD (chronic kidney disease)    Gallstones    History of kidney stones    Interstitial cystitis    Thyroid disease    UTI (urinary tract infection)     Past Surgical History:  Procedure Laterality Date   CATARACT EXTRACTION Bilateral    CHOLECYSTECTOMY     EYE SURGERY Bilateral    Cat Sx   PARTIAL HYSTERECTOMY     TUBAL LIGATION      Allergies  Allergen Reactions   Ciprofloxacin    Diflucan [Fluconazole]    Sulfa Antibiotics     Allergies as of 04/20/2022       Reactions   Ciprofloxacin    Diflucan [fluconazole]    Sulfa Antibiotics         Medication List        Accurate as of April 20, 2022 11:59 PM. If you have any questions, ask your nurse or doctor.          aspirin EC 81 MG tablet Take 81 mg by mouth daily.   calcium carbonate  1500 (600 Ca) MG Tabs tablet Commonly known as: OSCAL Take 600 mg of elemental calcium by mouth daily with breakfast.   EQL Vitamin D3 50 MCG (2000 UT) Caps Generic drug: Cholecalciferol Take 2,000 Units by mouth daily.   estradiol 1 MG tablet Commonly known as: ESTRACE INSERT 1/2 (ONE-HALF) TABLET VAGINALLY THREE TIMES PER WEEK   estradiol 1 MG tablet Commonly known as: ESTRACE INSERT 1 TABLET VAGINALLY THREE TIMES A WEEK   FISH OIL PO Take 2,000 mg by mouth daily.   GLUCOSAMINE 1500 COMPLEX PO Take 1,500 mg by mouth daily.   levothyroxine 75 MCG tablet Commonly known as: SYNTHROID TAKE 1/2 TABLET (37.5 MCG TOTAL) BY MOUTH DAILY BEFORE BREAKFAST.   miconazole 2 % vaginal cream Commonly known as: MONISTAT 7 Place 1 Applicatorful vaginally at bedtime.   MULTIVITAMIN ADULT PO Take 20 mg by mouth.   multivitamin-lutein Caps capsule Take 1 capsule by mouth daily.   polyethylene glycol 17 g packet Commonly known as: MIRALAX /  GLYCOLAX Take 17 g by mouth. Every other night   Shingrix injection Generic drug: Zoster Vaccine Adjuvanted        Review of Systems:  Review of Systems  Constitutional:  Negative for activity change, appetite change and fever.  HENT:  Positive for hearing loss. Negative for congestion and voice change.   Eyes:  Negative for visual disturbance.  Respiratory:  Negative for cough and shortness of breath.   Cardiovascular:  Negative for leg swelling.  Gastrointestinal:  Negative for abdominal pain, constipation, nausea and vomiting.       Bloated after Laxatives.   Genitourinary:  Negative for difficulty urinating, dysuria, urgency and vaginal pain.       Urination x1/night.   Musculoskeletal:  Negative for gait problem.  Skin:  Negative for color change.  Neurological:  Negative for speech difficulty, weakness and headaches.  Psychiatric/Behavioral:  Negative for behavioral problems and sleep disturbance. The patient is not nervous/anxious.         5 hours average sleep at night.     Health Maintenance  Topic Date Due   COVID-19 Vaccine (8 - 2023-24 season) 05/10/2022   Medicare Annual Wellness (AWV)  07/27/2022   DTaP/Tdap/Td (2 - Td or Tdap) 05/22/2030   Pneumonia Vaccine 81+ Years old  Completed   INFLUENZA VACCINE  Completed   DEXA SCAN  Completed   Zoster Vaccines- Shingrix  Completed   HPV VACCINES  Aged Out    Physical Exam: Vitals:   04/20/22 1512  BP: 118/72  Pulse: 75  Resp: 18  Temp: (!) 97.5 F (36.4 C)  SpO2: 98%  Weight: 147 lb (66.7 kg)  Height: 5\' 2"  (1.575 m)   Body mass index is 26.89 kg/m. Physical Exam Vitals and nursing note reviewed.  Constitutional:      Appearance: Normal appearance.     Comments: Over weight.   HENT:     Head: Normocephalic and atraumatic.     Mouth/Throat:     Mouth: Mucous membranes are moist.  Eyes:     Extraocular Movements: Extraocular movements intact.     Conjunctiva/sclera: Conjunctivae normal.     Pupils: Pupils are equal, round, and reactive to light.  Cardiovascular:     Rate and Rhythm: Normal rate and regular rhythm.     Heart sounds: No murmur heard. Pulmonary:     Effort: Pulmonary effort is normal.     Breath sounds: No rales.  Abdominal:     Palpations: Abdomen is soft.     Tenderness: There is no abdominal tenderness.     Comments: Shifting dullness on percussion.   Musculoskeletal:     Cervical back: Normal range of motion and neck supple.     Right lower leg: No edema.     Left lower leg: No edema.  Skin:    General: Skin is warm and dry.  Neurological:     General: No focal deficit present.     Mental Status: She is alert and oriented to person, place, and time. Mental status is at baseline.     Gait: Gait normal.  Psychiatric:        Mood and Affect: Mood normal.        Behavior: Behavior normal.        Thought Content: Thought content normal.        Judgment: Judgment normal.     Labs reviewed: Basic Metabolic  Panel: Recent Labs    12/06/21 0705  NA 137  K 4.5  CL 103  CO2 26  GLUCOSE 92  BUN 10  CREATININE 0.75  CALCIUM 9.1  TSH 4.13   Liver Function Tests: Recent Labs    12/06/21 0705  AST 21  ALT 21  BILITOT 0.3  PROT 6.5   No results for input(s): "LIPASE", "AMYLASE" in the last 8760 hours. No results for input(s): "AMMONIA" in the last 8760 hours. CBC: Recent Labs    12/06/21 0705  WBC 4.8  NEUTROABS 2,938  HGB 13.9  HCT 41.2  MCV 92.0  PLT 252   Lipid Panel: Recent Labs    12/06/21 0705  CHOL 189  HDL 60  LDLCALC 104*  TRIG 149  CHOLHDL 3.2   No results found for: "HGBA1C"  Procedures since last visit: No results found.  Assessment/Plan  Hypothyroidism on Levothyroxine 75mcg qd, TSH 4.13 12/06/21  Hyperlipidemia takes Fish oil, ASA 81mg  qd, LDL 104 12/06/21  Slow transit constipation MiraLax qod, feels floated sometimes, Linzess is too strong.   Vitamin D deficiency takes D, Vit D level 40   Labs/tests ordered:  none  Next appt:  6 months

## 2022-04-20 NOTE — Assessment & Plan Note (Signed)
takes D, Vit D level 40

## 2022-04-21 ENCOUNTER — Encounter: Payer: Self-pay | Admitting: Nurse Practitioner

## 2022-05-17 ENCOUNTER — Other Ambulatory Visit: Payer: Self-pay | Admitting: Nurse Practitioner

## 2022-06-16 ENCOUNTER — Ambulatory Visit
Admission: RE | Admit: 2022-06-16 | Discharge: 2022-06-16 | Disposition: A | Discharge status: Home / Self Care | Payer: Medicare HMO | Source: Ambulatory Visit | Attending: Internal Medicine | Admitting: Internal Medicine

## 2022-06-16 DIAGNOSIS — Z1231 Encounter for screening mammogram for malignant neoplasm of breast: Secondary | ICD-10-CM | POA: Diagnosis not present

## 2022-06-27 ENCOUNTER — Other Ambulatory Visit: Payer: Self-pay | Admitting: Nurse Practitioner

## 2022-06-27 DIAGNOSIS — E039 Hypothyroidism, unspecified: Secondary | ICD-10-CM

## 2022-07-16 ENCOUNTER — Other Ambulatory Visit: Payer: Self-pay | Admitting: Nurse Practitioner

## 2022-08-01 ENCOUNTER — Encounter: Payer: Medicare HMO | Admitting: Nurse Practitioner

## 2022-08-03 ENCOUNTER — Encounter: Payer: Self-pay | Admitting: Nurse Practitioner

## 2022-08-03 ENCOUNTER — Ambulatory Visit (INDEPENDENT_AMBULATORY_CARE_PROVIDER_SITE_OTHER): Payer: Medicare HMO | Admitting: Nurse Practitioner

## 2022-08-03 VITALS — BP 124/72 | HR 89 | Temp 98.0°F | Resp 17 | Ht 62.0 in

## 2022-08-03 DIAGNOSIS — Z0000 Encounter for general adult medical examination without abnormal findings: Secondary | ICD-10-CM | POA: Diagnosis not present

## 2022-08-03 NOTE — Progress Notes (Signed)
Subjective:   Claudia Hunt is a 82 y.o. female who presents for Medicare Annual (Subsequent) preventive examination in the Clinic @ [place].  Cardiac Risk Factors include: advanced age (>53mn, >>46women)     Objective:    Today's Vitals   08/03/22 1458  BP: 124/72  Pulse: 89  Resp: 17  Temp: 98 F (36.7 C)  TempSrc: Temporal  SpO2: 99%  Height: '5\' 2"'$  (1.575 m)   Body mass index is 26.89 kg/m.     08/03/2022    3:02 PM 04/20/2022   10:13 AM 12/15/2021   10:47 AM 09/27/2021   10:50 AM 07/26/2021    8:58 AM 06/09/2021   10:46 AM 12/02/2020    2:12 PM  Advanced Directives  Does Patient Have a Medical Advance Directive? Yes Yes Yes Yes Yes Yes Yes  Type of Advance Directive Living will;Healthcare Power of Attorney Living will;Out of facility DNR (pink MOST or yellow form) Out of facility DNR (pink MOST or yellow form);Living will;Healthcare Power of ABangor BaseLiving will HMonmouth BeachOut of facility DNR (pink MOST or yellow form) HAntrimLiving will;Out of facility DNR (pink MOST or yellow form) HPennwynOut of facility DNR (pink MOST or yellow form)  Does patient want to make changes to medical advance directive? No - Patient declined No - Patient declined No - Patient declined No - Patient declined No - Patient declined No - Patient declined No - Patient declined  Copy of HSycamorein Chart?   Yes - validated most recent copy scanned in chart (See row information) Yes - validated most recent copy scanned in chart (See row information) Yes - validated most recent copy scanned in chart (See row information) Yes - validated most recent copy scanned in chart (See row information) Yes - validated most recent copy scanned in chart (See row information)  Pre-existing out of facility DNR order (yellow form or pink MOST form)   Pink MOST form placed in chart (order not valid  for inpatient use)  Pink MOST form placed in chart (order not valid for inpatient use) Pink MOST form placed in chart (order not valid for inpatient use) Pink MOST form placed in chart (order not valid for inpatient use)    Current Medications (verified) Outpatient Encounter Medications as of 08/03/2022  Medication Sig   ABRYSVO 120 MCG/0.5ML injection Inject 0.5 mLs into the muscle once.   amoxicillin (AMOXIL) 500 MG capsule Take 500 mg by mouth 3 (three) times daily.   aspirin EC 81 MG tablet Take 81 mg by mouth daily.   calcium carbonate (OSCAL) 1500 (600 Ca) MG TABS tablet Take 600 mg of elemental calcium by mouth daily with breakfast.   Cholecalciferol (EQL VITAMIN D3) 50 MCG (2000 UT) CAPS Take 2,000 Units by mouth daily.   estradiol (ESTRACE) 1 MG tablet INSERT 1/2 (ONE-HALF) TABLET VAGINALLY THREE TIMES PER WEEK   estradiol (ESTRACE) 1 MG tablet TAKE 1 TABLET BY MOUTH THREE TIMES A WEEK   Glucosamine-Chondroit-Vit C-Mn (GLUCOSAMINE 1500 COMPLEX PO) Take 1,500 mg by mouth daily.   levothyroxine (SYNTHROID) 75 MCG tablet TAKE 1/2 (ONE-HALF) TABLET BY MOUTH ONCE DAILY BEFORE BREAKFAST   miconazole (MONISTAT 7) 2 % vaginal cream Place 1 Applicatorful vaginally at bedtime.   Multiple Vitamin (MULTIVITAMIN ADULT PO) Take 20 mg by mouth.   multivitamin-lutein (OCUVITE-LUTEIN) CAPS capsule Take 1 capsule by mouth daily.   Omega-3 Fatty Acids (FISH  OIL PO) Take 2,000 mg by mouth daily.   polyethylene glycol (MIRALAX / GLYCOLAX) 17 g packet Take 17 g by mouth. Every other night   SHINGRIX injection    SPIKEVAX syringe Inject 0.5 mLs into the muscle once.   No facility-administered encounter medications on file as of 08/03/2022.    Allergies (verified) Ciprofloxacin, Diflucan [fluconazole], and Sulfa antibiotics   History: Past Medical History:  Diagnosis Date   Arthritis    Chronic constipation    CKD (chronic kidney disease)    Gallstones    History of kidney stones    Interstitial  cystitis    Thyroid disease    UTI (urinary tract infection)    Past Surgical History:  Procedure Laterality Date   CATARACT EXTRACTION Bilateral    CHOLECYSTECTOMY     EYE SURGERY Bilateral    Cat Sx   PARTIAL HYSTERECTOMY     TUBAL LIGATION     Family History  Problem Relation Age of Onset   Cancer Father    Diabetes Father    Prostate cancer Brother    Colon cancer Paternal Uncle    Social History   Socioeconomic History   Marital status: Widowed    Spouse name: Not on file   Number of children: 3   Years of education: Not on file   Highest education level: Not on file  Occupational History   Occupation: retired Pharmacist, hospital   Tobacco Use   Smoking status: Never   Smokeless tobacco: Never  Vaping Use   Vaping Use: Never used  Substance and Sexual Activity   Alcohol use: Yes    Alcohol/week: 2.0 standard drinks of alcohol    Types: 2 Glasses of wine per week    Comment: weekly   Drug use: Never   Sexual activity: Not on file  Other Topics Concern   Not on file  Social History Narrative   Not on file   Social Determinants of Health   Financial Resource Strain: Not on file  Food Insecurity: Not on file  Transportation Needs: Not on file  Physical Activity: Not on file  Stress: Not on file  Social Connections: Not on file    Tobacco Counseling Counseling given: Not Answered   Clinical Intake:  Pre-visit preparation completed: Yes  Pain : No/denies pain     Nutritional Status: BMI 25 -29 Overweight Nutritional Risks: None Diabetes: No  How often do you need to have someone help you when you read instructions, pamphlets, or other written materials from your doctor or pharmacy?: 1 - Never  Diabetic?no  Interpreter Needed?: No  Information entered by :: Jose Corvin Bretta Bang NP   Activities of Daily Living    08/03/2022    3:19 PM 08/03/2022    3:01 PM  In your present state of health, do you have any difficulty performing the following activities:   Hearing? 0 0  Vision? 0 0  Difficulty concentrating or making decisions? 0 0  Walking or climbing stairs? 0 0  Dressing or bathing? 0 0  Doing errands, shopping? 0 0  Preparing Food and eating ? N   Using the Toilet? N   In the past six months, have you accidently leaked urine? Y   Comment better   Do you have problems with loss of bowel control? N   Managing your Medications? N   Managing your Finances? N   Housekeeping or managing your Housekeeping? N     Patient Care Team: Tamsin Nader X,  NP as PCP - General (Internal Medicine)  Indicate any recent Medical Services you may have received from other than [facility] providers in the past year (date may be approximate).     Assessment:   This is a routine wellness examination for [place].  Hearing/Vision screen No results found.  Dietary issues and exercise activities discussed: Current Exercise Habits: Home exercise routine (5000-6000 steps a day, 6 days a week), Type of exercise: walking, Time (Minutes): 60, Frequency (Times/Week): 6, Weekly Exercise (Minutes/Week): 360, Exercise limited by: None identified   Goals Addressed             This Visit's Progress    Maintain Mobility and Function       Evidence-based guidance:  Acknowledge and validate impact of pain, loss of strength and potential disfigurement (hand osteoarthritis) on mental health and daily life, such as social isolation, anxiety, depression, impaired sexual relationship and   injury from falls.  Anticipate referral to physical or occupational therapy for assessment, therapeutic exercise and recommendation for adaptive equipment or assistive devices; encourage participation.  Assess impact on ability to perform activities of daily living, as well as engage in sports and leisure events or requirements of work or school.  Provide anticipatory guidance and reassurance about the benefit of exercise to maintain function; acknowledge and normalize fear that exercise may  worsen symptoms.  Encourage regular exercise, at least 10 minutes at a time for 45 minutes per week; consider yoga, water exercise and proprioceptive exercises; encourage use of wearable activity tracker to increase motivation and adherence.  Encourage maintenance or resumption of daily activities, including employment, as pain allows and with minimal exposure to trauma.  Assist patient to advocate for adaptations to the work environment.  Consider level of pain and function, gender, age, lifestyle, patient preference, quality of life, readiness and ?ocapacity to benefit? when recommending patients for orthopaedic surgery consultation.  Explore strategies, such as changes to medication regimen or activity that enables patient to anticipate and manage flare-ups that increase deconditioning and disability.  Explore patient preferences; encourage exposure to a broader range of activities that have been avoided for fear of experiencing pain.  Identify barriers to participation in therapy or exercise, such as pain with activity, anticipated or imagined pain.  Monitor postoperative joint replacement or any preexisting joint replacement for ongoing pain and loss of function; provide social support and encouragement throughout recovery.   Notes:       Depression Screen    08/03/2022    3:19 PM 08/03/2022    3:01 PM 07/26/2021    8:55 AM 07/23/2020    1:10 PM  PHQ 2/9 Scores  PHQ - 2 Score 0 0 0 0    Fall Risk    08/03/2022    3:19 PM 08/03/2022    3:01 PM 04/20/2022   10:12 AM 09/27/2021   10:49 AM 07/26/2021    8:57 AM  [place] in the past year? 0 0 0 0 0  Number falls in past yr:  0 0 0 0  Injury with Fall?  0 0 0 0  Risk for fall due to :  No Fall Risks No Fall Risks No Fall Risks No Fall Risks  Follow up   Falls evaluation completed Falls evaluation completed Falls evaluation completed    [place]:  Any stairs in or around the home? Yes  If  so, are there any without handrails? No  Home free of  loose throw rugs in walkways, pet beds, electrical cords, etc? Yes  Adequate lighting in your home to reduce risk of falls? Yes   ASSISTIVE DEVICES UTILIZED TO PREVENT FALLS:  Life alert? No  Use of a cane, walker or w/c? No  Grab bars in the bathroom? Yes  Shower chair or bench in shower? No  Elevated toilet seat or a handicapped toilet? No   TIMED UP AND GO:  Was the test performed? No .    Gait steady and fast without use of assistive device  Cognitive Function:    08/03/2022    3:03 PM  MMSE - Mini Mental State Exam  Orientation to time 5  Orientation to Place 5  Registration 3  Attention/ Calculation 5  Recall 3  Language- name 2 objects 2  Language- repeat 1  Language- follow 3 step command 3  Language- read & follow direction 1  Write a sentence 1  Copy design 1  Total score 30        07/26/2021    8:59 AM 07/23/2020    1:15 PM  6CIT Screen  What Year? 0 points 0 points  What month? 0 points 0 points  What time? 0 points 0 points  Count back from 20 0 points 0 points  Months in reverse 0 points 0 points  Repeat phrase 2 points 0 points  Total Score 2 points 0 points    Immunizations Immunization History  Administered Date(s) Administered   Fluad Quad(high Dose 65+) 03/07/2021, 03/14/2022   Influenza, High Dose Seasonal PF 02/20/2019, 03/03/2020   Moderna Covid-19 Vaccine Bivalent Booster 69yr & up 10/07/2021, 03/15/2022   Moderna SARS-COV2 Booster Vaccination 03/30/2020, 10/19/2020   Moderna Sars-Covid-2 Vaccination 05/26/2019, 06/23/2019   Pfizer Covid-19 Vaccine Bivalent Booster 143yr& up 02/08/2021   Pneumococcal Conjugate-13 05/22/2013   Pneumococcal Polysaccharide-23 05/22/2014   Tdap 05/22/2020   Zoster Recombinat (Shingrix) 10/13/2021, 02/08/2022   Zoster, Live 10/20/2009    TDAP status: Up to date  Flu Vaccine status: Up to date  Pneumococcal vaccine status: Up to date  Covid-19  vaccine status: Completed vaccines  Qualifies for Shingles Vaccine? Yes   Zostavax completed Yes   Shingrix Completed?: Yes  Screening Tests Health Maintenance  Topic Date Due   COVID-19 Vaccine (8 - 2023-24 season) 08/19/2022 (Originally 05/10/2022)   Medicare Annual Wellness (AWV)  08/03/2023   DTaP/Tdap/Td (2 - Td or Tdap) 05/22/2030   Pneumonia Vaccine 6557Years old  Completed   INFLUENZA VACCINE  Completed   DEXA SCAN  Completed   Zoster Vaccines- Shingrix  Completed   HPV VACCINES  Aged Out    Health Maintenance  There are no preventive care reminders to display for this patient.   Colorectal cancer screening: No longer required.   Mammogram yearly  Bone Density status: Ordered 3. Pt provided with contact info and advised to call to schedule appt.  Lung Cancer Screening: (Low Dose CT Chest recommended if Age 84-80ears, 30 pack-year currently smoking OR have quit w/in 15years.) does not qualify.      Hepatitis C Screening: does not qualify  Vision Screening: Recommended annual ophthalmology exams for early detection of glaucoma and other disorders of the eye. Is the patient up to date with their annual eye exam?  Yes  Who is the provider or what is the name of the office in which the patient attends annual eye exams? Dr. BrBernarda Caffeyf pt is not established with a provider, would they like  to be referred to a provider to establish care? No .   Dental Screening: Recommended annual dental exams for proper oral hygiene  Community Resource Referral / Chronic Care Management: CRR required this visit?  No   CCM required this visit?  No      Plan:     I have personally reviewed and noted the following in the patient's chart:   Medical and social history Use of alcohol, tobacco or illicit drugs  Current medications and supplements including opioid prescriptions. Patient is not currently taking opioid prescriptions. Functional ability and status Nutritional  status Physical activity Advanced directives List of other physicians Hospitalizations, surgeries, and ER visits in previous 12 months Vitals Screenings to include cognitive, depression, and falls Referrals and appointments  In addition, I have reviewed and discussed with patient certain preventive protocols, quality metrics, and best practice recommendations. A written personalized care plan for preventive services as well as general preventive health recommendations were provided to patient.     Nevin Kozuch X Travian Kerner, NP   08/03/2022           Subjective:   Claudia Hunt is a 82 y.o. female who presents for Medicare Annual (Subsequent) preventive examination.   Cardiac Risk Factors include: advanced age (>86mn, >>64women)     Objective:    Today's Vitals   08/03/22 1458  BP: 124/72  Pulse: 89  Resp: 17  Temp: 98 F (36.7 C)  TempSrc: Temporal  SpO2: 99%  Height: '5\' 2"'$  (1.575 m)   Body mass index is 26.89 kg/m.     08/03/2022    3:02 PM 04/20/2022   10:13 AM 12/15/2021   10:47 AM 09/27/2021   10:50 AM 07/26/2021    8:58 AM 06/09/2021   10:46 AM 12/02/2020    2:12 PM  Advanced Directives  Does Patient Have a Medical Advance Directive? Yes Yes Yes Yes Yes Yes Yes  Type of Advance Directive Living will;Healthcare Power of Attorney Living will;Out of facility DNR (pink MOST or yellow form) Out of facility DNR (pink MOST or yellow form);Living will;Healthcare Power of AFort Polk SouthLiving will HBerniceOut of facility DNR (pink MOST or yellow form) HCenterLiving will;Out of facility DNR (pink MOST or yellow form) HClaytonOut of facility DNR (pink MOST or yellow form)  Does patient want to make changes to medical advance directive? No - Patient declined No - Patient declined No - Patient declined No - Patient declined No - Patient declined No - Patient declined No - Patient declined  Copy of  H[place]in Chart?   Yes - validated most recent copy scanned in chart (See row information) Yes - validated most recent copy scanned in chart (See row information) Yes - validated most recent copy scanned in chart (See row information) Yes - validated most recent copy scanned in chart (See row information) Yes - validated most recent copy scanned in chart (See row information)  Pre-existing out of facility DNR order (yellow form or pink MOST form)   Pink MOST form placed in chart (order not valid for inpatient use)  Pink MOST form placed in chart (order not valid for inpatient use) Pink MOST form placed in chart (order not valid for inpatient use) Pink MOST form placed in chart (order not valid for inpatient use)    Current Medications (verified) Outpatient Encounter Medications as of 08/03/2022  Medication Sig   ABRYSVO 120 MCG/0.5ML injection Inject  0.5 mLs into the muscle once.   amoxicillin (AMOXIL) 500 MG capsule Take 500 mg by mouth 3 (three) times daily.   aspirin EC 81 MG tablet Take 81 mg by mouth daily.   calcium carbonate (OSCAL) 1500 (600 Ca) MG TABS tablet Take 600 mg of elemental calcium by mouth daily with breakfast.   Cholecalciferol (EQL VITAMIN D3) 50 MCG (2000 UT) CAPS Take 2,000 Units by mouth daily.   estradiol (ESTRACE) 1 MG tablet INSERT 1/2 (ONE-HALF) TABLET VAGINALLY THREE TIMES PER WEEK   estradiol (ESTRACE) 1 MG tablet TAKE 1 TABLET BY MOUTH THREE TIMES A WEEK   Glucosamine-Chondroit-Vit C-Mn (GLUCOSAMINE 1500 COMPLEX PO) Take 1,500 mg by mouth daily.   levothyroxine (SYNTHROID) 75 MCG tablet TAKE 1/2 (ONE-HALF) TABLET BY MOUTH ONCE DAILY BEFORE BREAKFAST   miconazole (MONISTAT 7) 2 % vaginal cream Place 1 Applicatorful vaginally at bedtime.   Multiple Vitamin (MULTIVITAMIN ADULT PO) Take 20 mg by mouth.   multivitamin-lutein (OCUVITE-LUTEIN) CAPS capsule Take 1 capsule by mouth daily.   Omega-3 Fatty Acids (FISH OIL PO) Take 2,000 mg by mouth daily.    polyethylene glycol (MIRALAX / GLYCOLAX) 17 g packet Take 17 g by mouth. Every other night   SHINGRIX injection    SPIKEVAX syringe Inject 0.5 mLs into the muscle once.   No facility-administered encounter medications on file as of 08/03/2022.    Allergies (verified) Ciprofloxacin, Diflucan [fluconazole], and Sulfa antibiotics   History: Past Medical History:  Diagnosis Date   Arthritis    Chronic constipation    CKD (chronic kidney disease)    Gallstones    History of kidney stones    Interstitial cystitis    Thyroid disease    UTI (urinary tract infection)    Past Surgical History:  Procedure Laterality Date   CATARACT EXTRACTION Bilateral    CHOLECYSTECTOMY     EYE SURGERY Bilateral    Cat Sx   PARTIAL HYSTERECTOMY     TUBAL LIGATION     Family History  Problem Relation Age of Onset   Cancer Father    Diabetes Father    Prostate cancer Brother    Colon cancer Paternal Uncle    Social History   Socioeconomic History   Marital status: Widowed    Spouse name: Not on file   Number of children: 3   Years of education: Not on file   Highest education level: Not on file  Occupational History   Occupation: retired Pharmacist, hospital   Tobacco Use   Smoking status: Never   Smokeless tobacco: Never  Vaping Use   Vaping Use: Never used  Substance and Sexual Activity   Alcohol use: Yes    Alcohol/week: 2.0 standard drinks of alcohol    Types: 2 Glasses of wine per week    Comment: weekly   Drug use: Never   Sexual activity: Not on file  Other Topics Concern   Not on file  Social History Narrative   Not on file   Social Determinants of Health   Financial Resource Strain: Not on file  Food Insecurity: Not on file  Transportation Needs: Not on file  Physical Activity: Not on file  Stress: Not on file  Social Connections: Not on file    Tobacco Counseling Counseling given: Not Answered   Clinical Intake:  Pre-visit preparation completed: Yes  Pain :  No/denies pain     Nutritional Status: BMI 25 -29 Overweight Nutritional Risks: None Diabetes: No  How often do you  need to have someone help you when you read instructions, pamphlets, or other written materials from your doctor or pharmacy?: 1 - Never  Diabetic?  Interpreter Needed?: No  Information entered by :: Tranisha Tissue Bretta Bang NP   Activities of Daily Living    08/03/2022    3:19 PM 08/03/2022    3:01 PM  In your present state of health, do you have any difficulty performing the following activities:  Hearing? 0 0  Vision? 0 0  Difficulty concentrating or making decisions? 0 0  Walking or climbing stairs? 0 0  Dressing or bathing? 0 0  Doing errands, shopping? 0 0  Preparing Food and eating ? N   Using the Toilet? N   In the past six months, have you accidently leaked urine? Y   Comment better   Do you have problems with loss of bowel control? N   Managing your Medications? N   Managing your Finances? N   Housekeeping or managing your Housekeeping? N     Patient Care Team: Neythan Kozlov X, NP as PCP - General (Internal Medicine)  Indicate any recent Medical Services you may have received from other than [facility] providers in the past year (date may be approximate).     Assessment:   This is a routine wellness examination for [place].  Hearing/Vision screen No results found.  Dietary issues and exercise activities discussed: Current Exercise Habits: Home exercise routine (5000-6000 steps a day, 6 days a week), Type of exercise: walking, Time (Minutes): 60, Frequency (Times/Week): 6, Weekly Exercise (Minutes/Week): 360, Exercise limited by: None identified   Goals Addressed             This Visit's Progress    Maintain Mobility and Function       Evidence-based guidance:  Acknowledge and validate impact of pain, loss of strength and potential disfigurement (hand osteoarthritis) on mental health and daily life, such as social isolation, anxiety, depression, impaired  sexual relationship and   injury from falls.  Anticipate referral to physical or occupational therapy for assessment, therapeutic exercise and recommendation for adaptive equipment or assistive devices; encourage participation.  Assess impact on ability to perform activities of daily living, as well as engage in sports and leisure events or requirements of work or school.  Provide anticipatory guidance and reassurance about the benefit of exercise to maintain function; acknowledge and normalize fear that exercise may worsen symptoms.  Encourage regular exercise, at least 10 minutes at a time for 45 minutes per week; consider yoga, water exercise and proprioceptive exercises; encourage use of wearable activity tracker to increase motivation and adherence.  Encourage maintenance or resumption of daily activities, including employment, as pain allows and with minimal exposure to trauma.  Assist patient to advocate for adaptations to the work environment.  Consider level of pain and function, gender, age, lifestyle, patient preference, quality of life, readiness and ?ocapacity to benefit? when recommending patients for orthopaedic surgery consultation.  Explore strategies, such as changes to medication regimen or activity that enables patient to anticipate and manage flare-ups that increase deconditioning and disability.  Explore patient preferences; encourage exposure to a broader range of activities that have been avoided for fear of experiencing pain.  Identify barriers to participation in therapy or exercise, such as pain with activity, anticipated or imagined pain.  Monitor postoperative joint replacement or any preexisting joint replacement for ongoing pain and loss of function; provide social support and encouragement throughout recovery.   Notes:  Depression Screen    08/03/2022    3:19 PM 08/03/2022    3:01 PM 07/26/2021    8:55 AM 07/23/2020    1:10 PM  PHQ 2/9 Scores  PHQ - 2 Score  0 0 0 0    Fall Risk    08/03/2022    3:19 PM 08/03/2022    3:01 PM 04/20/2022   10:12 AM 09/27/2021   10:49 AM 07/26/2021    8:57 AM  [place] in the past year? 0 0 0 0 0  Number falls in past yr:  0 0 0 0  Injury with Fall?  0 0 0 0  Risk for fall due to :  No Fall Risks No Fall Risks No Fall Risks No Fall Risks  Follow up   Falls evaluation completed Falls evaluation completed Falls evaluation completed    [place]:  Any stairs in or around the home?  If so, are there any without handrails?  Home free of loose throw rugs in walkways, pet beds, electrical cords, etc?  Adequate lighting in your home to reduce risk of falls?   ASSISTIVE DEVICES UTILIZED TO PREVENT FALLS:  Life alert?  Use of a cane, walker or w/c?  Grab bars in the bathroom?  Shower chair or bench in shower?  Elevated toilet seat or a handicapped toilet?   TIMED UP AND GO:  Was the test performed? .  Length of time to ambulate 10 feet:  sec.     Cognitive Function:    08/03/2022    3:03 PM  MMSE - Mini Mental State Exam  Orientation to time 5  Orientation to Place 5  Registration 3  Attention/ Calculation 5  Recall 3  Language- name 2 objects 2  Language- repeat 1  Language- follow 3 step command 3  Language- read & follow direction 1  Write a sentence 1  Copy design 1  Total score 30        07/26/2021    8:59 AM 07/23/2020    1:15 PM  6CIT Screen  What Year? 0 points 0 points  What month? 0 points 0 points  What time? 0 points 0 points  Count back from 20 0 points 0 points  Months in reverse 0 points 0 points  Repeat phrase 2 points 0 points  Total Score 2 points 0 points    Immunizations Immunization History  Administered Date(s) Administered   Fluad Quad(high Dose 65+) 03/07/2021, 03/14/2022   Influenza, High Dose Seasonal PF 02/20/2019, 03/03/2020   Moderna Covid-19 Vaccine Bivalent Booster 51yr & up 10/07/2021, 03/15/2022   Moderna  SARS-COV2 Booster Vaccination 03/30/2020, 10/19/2020   Moderna Sars-Covid-2 Vaccination 05/26/2019, 06/23/2019   Pfizer Covid-19 Vaccine Bivalent Booster 148yr& up 02/08/2021   Pneumococcal Conjugate-13 05/22/2013   Pneumococcal Polysaccharide-23 05/22/2014   Tdap 05/22/2020   Zoster Recombinat (Shingrix) 10/13/2021, 02/08/2022   Zoster, Live 10/20/2009            Qualifies for Shingles Vaccine?   Zostavax completed     Screening Tests Health Maintenance  Topic Date Due   COVID-19 Vaccine (8 - 2023-24 season) 08/19/2022 (Originally 05/10/2022)   Medicare Annual Wellness (AWV)  08/03/2023   DTaP/Tdap/Td (2 - Td or Tdap) 05/22/2030   Pneumonia Vaccine 6564Years old  Completed   INFLUENZA VACCINE  Completed   DEXA SCAN  Completed   Zoster Vaccines- Shingrix  Completed   HPV VACCINES  Aged Out  Health Maintenance  There are no preventive care reminders to display for this patient.        Lung Cancer Screening: (Low Dose CT Chest recommended if Age 79-80 years, 30 pack-year currently smoking OR have quit w/in 15years.)  qualify.   Lung Cancer Screening Referral:   Additional Screening:  Hepatitis C Screening:  qualify; Completed   Vision Screening: Recommended annual ophthalmology exams for early detection of glaucoma and other disorders of the eye. Is the patient up to date with their annual eye exam?   Who is the provider or what is the name of the office in which the patient attends annual eye exams?  If pt is not established with a provider, would they like to be referred to a provider to establish care? .   Dental Screening: Recommended annual dental exams for proper oral hygiene  Community Resource Referral / Chronic Care Management: CRR required this visit?    CCM required this visit?       Plan:     I have personally reviewed and noted the following in the patient's chart:   Medical and social history Use of alcohol, tobacco or illicit  drugs  Current medications and supplements including opioid prescriptions.  Functional ability and status Nutritional status Physical activity Advanced directives List of other physicians Hospitalizations, surgeries, and ER visits in previous 12 months Vitals Screenings to include cognitive, depression, and falls Referrals and appointments  In addition, I have reviewed and discussed with patient certain preventive protocols, quality metrics, and best practice recommendations. A written personalized care plan for preventive services as well as general preventive health recommendations were provided to patient.     Racquelle Hyser X Yaphet Smethurst, NP   08/03/2022   Nurse Notes:

## 2022-08-23 ENCOUNTER — Ambulatory Visit (INDEPENDENT_AMBULATORY_CARE_PROVIDER_SITE_OTHER): Payer: Medicare HMO | Admitting: Family Medicine

## 2022-08-23 ENCOUNTER — Encounter: Payer: Self-pay | Admitting: Family Medicine

## 2022-08-23 VITALS — BP 122/88 | HR 79 | Temp 97.6°F | Resp 18 | Ht 62.0 in | Wt 146.4 lb

## 2022-08-23 DIAGNOSIS — M1712 Unilateral primary osteoarthritis, left knee: Secondary | ICD-10-CM

## 2022-08-23 MED ORDER — MELOXICAM 7.5 MG PO TABS: 7.5000 mg | ORAL_TABLET | Freq: Every day | ORAL | 0 refills | Status: DC

## 2022-08-23 NOTE — Progress Notes (Signed)
Provider:  Alain Honey, MD  Careteam: Patient Care Team: Mast, Man X, NP as PCP - General (Internal Medicine)  PLACE OF SERVICE:  [place]  Advanced Directive information    Allergies  Allergen Reactions   Ciprofloxacin    Diflucan [Fluconazole]    Sulfa Antibiotics     Chief Complaint  Patient presents with   Acute Visit    Knee Pain     HPI: Patient is a 82 y.o. female patient complains of pain in her left knee.  There is no history of trauma or injury.  There is no history of the knee giving way or locking.  There is some swelling in the knee hurts more after she has been walking which she is likes to do in fact is a hiker.  There is no history of noise from the knee as she goes up and down stairs.  She denies pain in hips or back  Review of Systems:  Review of Systems  Eyes: Negative.   Respiratory: Negative.    Cardiovascular: Negative.   Gastrointestinal: Negative.   Genitourinary: Negative.   Musculoskeletal:  Positive for joint pain.  Neurological: Negative.   Psychiatric/Behavioral: Negative.    All other systems reviewed and are negative.   Past Medical History:  Diagnosis Date   Arthritis    Chronic constipation    CKD (chronic kidney disease)    Gallstones    History of kidney stones    Interstitial cystitis    Thyroid disease    UTI (urinary tract infection)    Past Surgical History:  Procedure Laterality Date   CATARACT EXTRACTION Bilateral    CHOLECYSTECTOMY     EYE SURGERY Bilateral    Cat Sx   PARTIAL HYSTERECTOMY     TUBAL LIGATION     Social History:   reports that she has never smoked. She has never used smokeless tobacco. She reports current alcohol use of about 2.0 standard drinks of alcohol per week. She reports that she does not use drugs.  Family History  Problem Relation Age of Onset   Cancer Father    Diabetes Father    Prostate cancer Brother    Colon cancer Paternal Uncle     Medications: Patient's  Medications  New Prescriptions   No medications on file  Previous Medications   ABRYSVO 120 MCG/0.5ML INJECTION    Inject 0.5 mLs into the muscle once.   AMOXICILLIN (AMOXIL) 500 MG CAPSULE    Take 500 mg by mouth 3 (three) times daily.   ASPIRIN EC 81 MG TABLET    Take 81 mg by mouth daily.   CALCIUM CARBONATE (OSCAL) 1500 (600 CA) MG TABS TABLET    Take 600 mg of elemental calcium by mouth daily with breakfast.   CHOLECALCIFEROL (EQL VITAMIN D3) 50 MCG (2000 UT) CAPS    Take 2,000 Units by mouth daily.   ESTRADIOL (ESTRACE) 1 MG TABLET    INSERT 1/2 (ONE-HALF) TABLET VAGINALLY THREE TIMES PER WEEK   GLUCOSAMINE-CHONDROIT-VIT C-MN (GLUCOSAMINE 1500 COMPLEX PO)    Take 1,500 mg by mouth daily.   LEVOTHYROXINE (SYNTHROID) 75 MCG TABLET    TAKE 1/2 (ONE-HALF) TABLET BY MOUTH ONCE DAILY BEFORE BREAKFAST   MICONAZOLE (MONISTAT 7) 2 % VAGINAL CREAM    Place 1 Applicatorful vaginally at bedtime.   MULTIPLE VITAMIN (MULTIVITAMIN ADULT PO)    Take 20 mg by mouth.   MULTIVITAMIN-LUTEIN (OCUVITE-LUTEIN) CAPS CAPSULE    Take 1 capsule by mouth  daily.   OMEGA-3 FATTY ACIDS (FISH OIL PO)    Take 2,000 mg by mouth daily.   POLYETHYLENE GLYCOL (MIRALAX / GLYCOLAX) 17 G PACKET    Take 17 g by mouth. Every other night   SHINGRIX INJECTION       SPIKEVAX SYRINGE    Inject 0.5 mLs into the muscle once.  Modified Medications   No medications on file  Discontinued Medications   ESTRADIOL (ESTRACE) 1 MG TABLET    TAKE 1 TABLET BY MOUTH THREE TIMES A WEEK    Physical Exam:  There were no vitals filed for this visit. There is no height or weight on file to calculate BMI. Wt Readings from Last 3 Encounters:  04/20/22 147 lb (66.7 kg)  12/15/21 142 lb (64.4 kg)  06/09/21 142 lb (64.4 kg)    Physical Exam Vitals and nursing note reviewed.  Constitutional:      Appearance: Normal appearance.  HENT:     Head: Normocephalic.  Cardiovascular:     Rate and Rhythm: Normal rate.  Pulmonary:     Effort:  Pulmonary effort is normal.  Musculoskeletal:        General: Normal range of motion.     Comments: Left knee: There is some effusion present.  No tenderness along joint line but I do think there is a slight instability when stressed.  Anterior drawer sign is negative.  There is no crepitance appreciated  Neurological:     Mental Status: She is alert.     Labs reviewed: Basic Metabolic Panel: Recent Labs    12/06/21 0705  NA 137  K 4.5  CL 103  CO2 26  GLUCOSE 92  BUN 10  CREATININE 0.75  CALCIUM 9.1  TSH 4.13   Liver Function Tests: Recent Labs    12/06/21 0705  AST 21  ALT 21  BILITOT 0.3  PROT 6.5   No results for input(s): "LIPASE", "AMYLASE" in the last 8760 hours. No results for input(s): "AMMONIA" in the last 8760 hours. CBC: Recent Labs    12/06/21 0705  WBC 4.8  NEUTROABS 2,938  HGB 13.9  HCT 41.2  MCV 92.0  PLT 252   Lipid Panel: Recent Labs    12/06/21 0705  CHOL 189  HDL 60  LDLCALC 104*  TRIG 149  CHOLHDL 3.2   TSH: Recent Labs    12/06/21 0705  TSH 4.13   A1C: No results found for: "HGBA1C"   Assessment/Plan  1. Primary osteoarthritis of left knee We discussed x-ray and potential treatments.  Will try meloxicam 7.5 mg.  She has taken this before for shoulder bursitis and it was effective.  If after 1 month symptoms are not relieved consider injection.  Also discussed x-ray which I think would be good but she would just as soon hold off for now.   Alain Honey, MD [facility] [phone]

## 2022-08-31 ENCOUNTER — Telehealth: Payer: Self-pay

## 2022-08-31 DIAGNOSIS — M1712 Unilateral primary osteoarthritis, left knee: Secondary | ICD-10-CM

## 2022-08-31 NOTE — Telephone Encounter (Signed)
Patient called stating that her knee is still hurting and she woul dlike to have an xray done.  Message sent to Dr. Jacalyn Lefevre

## 2022-09-05 NOTE — Telephone Encounter (Signed)
Pt seen 4-10; NSAID did not help; desires x-ray

## 2022-09-23 ENCOUNTER — Other Ambulatory Visit: Payer: Self-pay | Admitting: Nurse Practitioner

## 2022-09-23 DIAGNOSIS — E039 Hypothyroidism, unspecified: Secondary | ICD-10-CM

## 2022-10-13 ENCOUNTER — Encounter (INDEPENDENT_AMBULATORY_CARE_PROVIDER_SITE_OTHER): Payer: Medicare HMO | Admitting: Ophthalmology

## 2022-10-26 ENCOUNTER — Encounter: Payer: Self-pay | Admitting: Nurse Practitioner

## 2022-10-26 ENCOUNTER — Non-Acute Institutional Stay: Payer: Medicare HMO | Admitting: Nurse Practitioner

## 2022-10-26 VITALS — BP 126/80 | HR 94 | Temp 97.5°F | Resp 18 | Ht 62.0 in | Wt 147.0 lb

## 2022-10-26 DIAGNOSIS — E559 Vitamin D deficiency, unspecified: Secondary | ICD-10-CM

## 2022-10-26 DIAGNOSIS — K5901 Slow transit constipation: Secondary | ICD-10-CM | POA: Diagnosis not present

## 2022-10-26 DIAGNOSIS — E785 Hyperlipidemia, unspecified: Secondary | ICD-10-CM | POA: Diagnosis not present

## 2022-10-26 DIAGNOSIS — E039 Hypothyroidism, unspecified: Secondary | ICD-10-CM | POA: Diagnosis not present

## 2022-10-26 NOTE — Assessment & Plan Note (Signed)
on Levothyroxine 75mcg qd, TSH 4.13 12/06/21 

## 2022-10-26 NOTE — Assessment & Plan Note (Signed)
takes Fish oil, ASA 81mg qd, LDL 104 12/06/21 

## 2022-10-26 NOTE — Assessment & Plan Note (Signed)
MiraLax qod, feels floated sometimes, Linzess is too strong.  

## 2022-10-26 NOTE — Assessment & Plan Note (Signed)
stable, off Famotidine 20mg bid. Hgb 13.9 12/06/21 

## 2022-10-26 NOTE — Progress Notes (Signed)
Location:   Clinic [facility]   Place of Service:  Clinic (12) Provider: Chipper Oman NP  Code Status: DNR Goals of Care:     10/26/2022   12:41 PM  Advanced Directives  Does Patient Have a Medical Advance Directive? Yes  Type of Advance Directive Living will;Out of facility DNR (pink MOST or yellow form)  Does patient want to make changes to medical advance directive? No - Patient declined  Pre-existing out of facility DNR order (yellow form or pink MOST form) Yellow form placed in chart (order not valid for inpatient use)     Chief Complaint  Patient presents with   Medical Management of Chronic Issues    Patient is here for a follow up for chronic conditions     HPI: Patient is a 82 y.o. female seen today for medical management of chronic diseases.      GERD, stable, off Famotidine 20mg  bid. Hgb 13.9 12/06/21             Postmenopausal symptoms, managed with Estradiol 0.25mg  qd.        Hypothyroidism, on Levothyroxine qd, TSH 4.13 12/06/21             Hyperlipidemia, takes Fish oil, ASA 81mg  qd, LDL 104 12/06/21             Constipation, MiraLax qod, feels floated sometimes, Linzess is too strong.              Vitamin D deficiency, takes D, Vit D level 40    Past Medical History:  Diagnosis Date   Arthritis    Chronic constipation    CKD (chronic kidney disease)    Gallstones    History of kidney stones    Interstitial cystitis    Thyroid disease    UTI (urinary tract infection)     Past Surgical History:  Procedure Laterality Date   CATARACT EXTRACTION Bilateral    CHOLECYSTECTOMY     EYE SURGERY Bilateral    Cat Sx   PARTIAL HYSTERECTOMY     TUBAL LIGATION      Allergies  Allergen Reactions   Ciprofloxacin    Diflucan [Fluconazole]    Sulfa Antibiotics     Allergies as of 10/26/2022       Reactions   Ciprofloxacin    Diflucan [fluconazole]    Sulfa Antibiotics         Medication List        Accurate as of October 26, 2022  4:29 PM. If you  have any questions, ask your nurse or doctor.          Abrysvo 120 MCG/0.5ML injection Generic drug: RSV bivalent vaccine Inject 0.5 mLs into the muscle once.   amoxicillin 500 MG capsule Commonly known as: AMOXIL Take 500 mg by mouth 3 (three) times daily.   aspirin EC 81 MG tablet Take 81 mg by mouth daily.   calcium carbonate 1500 (600 Ca) MG Tabs tablet Commonly known as: OSCAL Take 600 mg of elemental calcium by mouth daily with breakfast.   EQL Vitamin D3 50 MCG (2000 UT) Caps Generic drug: Cholecalciferol Take 2,000 Units by mouth daily.   estradiol 1 MG tablet Commonly known as: ESTRACE INSERT 1/2 (ONE-HALF) TABLET VAGINALLY THREE TIMES PER WEEK   FISH OIL PO Take 2,000 mg by mouth daily.   GLUCOSAMINE 1500 COMPLEX PO Take 1,500 mg by mouth daily.   levothyroxine 75 MCG tablet Commonly known as: SYNTHROID TAKE 1/2 (ONE-HALF)  TABLET BY MOUTH ONCE DAILY BEFORE BREAKFAST   meloxicam 7.5 MG tablet Commonly known as: MOBIC Take 1 tablet (7.5 mg total) by mouth daily.   miconazole 2 % vaginal cream Commonly known as: MONISTAT 7 Place 1 Applicatorful vaginally at bedtime.   MULTIVITAMIN ADULT PO Take 20 mg by mouth.   multivitamin-lutein Caps capsule Take 1 capsule by mouth daily.   polyethylene glycol 17 g packet Commonly known as: MIRALAX / GLYCOLAX Take 17 g by mouth. Every other night   Shingrix injection Generic drug: Zoster Vaccine Adjuvanted   Spikevax syringe Generic drug: COVID-19 mRNA vaccine 2023-2024 Inject 0.5 mLs into the muscle once.        Review of Systems:  Review of Systems  Constitutional:  Negative for activity change, appetite change and fever.  HENT:  Positive for hearing loss. Negative for congestion and voice change.   Eyes:  Negative for visual disturbance.  Respiratory:  Negative for cough and shortness of breath.   Cardiovascular:  Negative for leg swelling.  Gastrointestinal:  Negative for abdominal pain,  constipation, nausea and vomiting.       Bloated after Laxatives.   Genitourinary:  Negative for difficulty urinating, dysuria, urgency and vaginal pain.       Urination x1/night.   Musculoskeletal:  Negative for gait problem.  Skin:  Negative for color change.  Neurological:  Negative for speech difficulty, weakness and headaches.  Psychiatric/Behavioral:  Negative for behavioral problems and sleep disturbance. The patient is not nervous/anxious.        5 hours average sleep at night.     Health Maintenance  Topic Date Due   COVID-19 Vaccine (8 - 2023-24 season) 05/10/2022   INFLUENZA VACCINE  12/21/2022   Medicare Annual Wellness (AWV)  08/03/2023   DTaP/Tdap/Td (2 - Td or Tdap) 05/22/2030   Pneumonia Vaccine 2+ Years old  Completed   DEXA SCAN  Completed   Zoster Vaccines- Shingrix  Completed   HPV VACCINES  Aged Out    Physical Exam: Vitals:   10/26/22 1307  BP: 126/80  Pulse: 94  Resp: 18  Temp: (!) 97.5 F (36.4 C)  SpO2: 97%  Weight: 147 lb (66.7 kg)  Height: 5\' 2"  (1.575 m)   Body mass index is 26.89 kg/m. Physical Exam Vitals and nursing note reviewed.  Constitutional:      Appearance: Normal appearance.     Comments: Over weight.   HENT:     Head: Normocephalic and atraumatic.     Mouth/Throat:     Mouth: Mucous membranes are moist.  Eyes:     Extraocular Movements: Extraocular movements intact.     Conjunctiva/sclera: Conjunctivae normal.     Pupils: Pupils are equal, round, and reactive to light.  Cardiovascular:     Rate and Rhythm: Normal rate and regular rhythm.     Heart sounds: No murmur heard. Pulmonary:     Effort: Pulmonary effort is normal.     Breath sounds: No rales.  Abdominal:     Palpations: Abdomen is soft.     Tenderness: There is no abdominal tenderness.     Comments: Shifting dullness on percussion.   Musculoskeletal:     Cervical back: Normal range of motion and neck supple.     Right lower leg: No edema.     Left lower  leg: No edema.  Skin:    General: Skin is warm and dry.  Neurological:     General: No focal deficit present.  Mental Status: She is alert and oriented to person, place, and time. Mental status is at baseline.     Gait: Gait normal.  Psychiatric:        Mood and Affect: Mood normal.        Behavior: Behavior normal.        Thought Content: Thought content normal.        Judgment: Judgment normal.     Labs reviewed: Basic Metabolic Panel: Recent Labs    12/06/21 0705  NA 137  K 4.5  CL 103  CO2 26  GLUCOSE 92  BUN 10  CREATININE 0.75  CALCIUM 9.1  TSH 4.13   Liver Function Tests: Recent Labs    12/06/21 0705  AST 21  ALT 21  BILITOT 0.3  PROT 6.5   No results for input(s): "LIPASE", "AMYLASE" in the last 8760 hours. No results for input(s): "AMMONIA" in the last 8760 hours. CBC: Recent Labs    12/06/21 0705  WBC 4.8  NEUTROABS 2,938  HGB 13.9  HCT 41.2  MCV 92.0  PLT 252   Lipid Panel: Recent Labs    12/06/21 0705  CHOL 189  HDL 60  LDLCALC 104*  TRIG 149  CHOLHDL 3.2   No results found for: "HGBA1C"  Procedures since last visit: No results found.  Assessment/Plan  Hypothyroidism on Levothyroxine qd, TSH 4.13 12/06/21  Hyperlipidemia takes Fish oil, ASA 81mg  qd, LDL 104 12/06/21  Slow transit constipation MiraLax qod, feels floated sometimes, Linzess is too strong.   Vitamin D deficiency takes D, Vit D level 40  GERD (gastroesophageal reflux disease) stable, off Famotidine 20mg  bid. Hgb 13.9 12/06/21   Labs/tests ordered:  CBC/diff, CMP/eGFR, TSH, Hgb A1c, lipids, Vit D 12/18/32  Next appt:  6 months.

## 2022-10-26 NOTE — Assessment & Plan Note (Signed)
takes D, Vit D level 40 °

## 2022-11-09 NOTE — Progress Notes (Signed)
[facility] - Clinic Note  11/15/2022     CHIEF COMPLAINT Patient presents for Retina Follow Up    HISTORY OF PRESENT ILLNESS: Claudia Hunt is a 82 y.o. female who presents to the clinic today for:   HPI     Retina Follow Up   Patient presents with  Other.  In both eyes.  This started years ago.  Duration of 1 year.  I, the attending physician,  performed the HPI with the patient and updated documentation appropriately.        Comments   Patient feels that the vision is the same. She is not using eye drops.       Last edited by Rennis Chris, MD on 11/17/2022  2:16 AM.    Pt states she does not have a regular eye dr, she is able to read without glasses  Referring physician: Mast, Man X, NP [id number] [place],  [place] [address]  HISTORICAL INFORMATION:   Selected notes from the MEDICAL RECORD NUMBER Referred by Dr. Chales Abrahams   CURRENT MEDICATIONS: No current outpatient medications on file. (Ophthalmic Drugs)   No current facility-administered medications for this visit. (Ophthalmic Drugs)   Current Outpatient Medications (Other)  Medication Sig   ABRYSVO 120 MCG/0.5ML injection Inject 0.5 mLs into the muscle once.   amoxicillin (AMOXIL) 500 MG capsule Take 500 mg by mouth 3 (three) times daily.   aspirin EC 81 MG tablet Take 81 mg by mouth daily.   calcium carbonate (OSCAL) 1500 (600 Ca) MG TABS tablet Take 600 mg of elemental calcium by mouth daily with breakfast.   Cholecalciferol (EQL VITAMIN D3) 50 MCG (2000 UT) CAPS Take 2,000 Units by mouth daily.   estradiol (ESTRACE) 1 MG tablet INSERT 1/2 (ONE-HALF) TABLET VAGINALLY THREE TIMES PER WEEK   Glucosamine-Chondroit-Vit C-Mn (GLUCOSAMINE 1500 COMPLEX PO) Take 1,500 mg by mouth daily.   levothyroxine (SYNTHROID) 75 MCG tablet TAKE 1/2 (ONE-HALF) TABLET BY MOUTH ONCE DAILY BEFORE BREAKFAST   meloxicam (MOBIC) 7.5 MG tablet Take 1 tablet (7.5 mg total) by mouth daily.   miconazole (MONISTAT  7) 2 % vaginal cream Place 1 Applicatorful vaginally at bedtime.   Multiple Vitamin (MULTIVITAMIN ADULT PO) Take 20 mg by mouth.   multivitamin-lutein (OCUVITE-LUTEIN) CAPS capsule Take 1 capsule by mouth daily.   Omega-3 Fatty Acids (FISH OIL PO) Take 2,000 mg by mouth daily.   polyethylene glycol (MIRALAX / GLYCOLAX) 17 g packet Take 17 g by mouth. Every other night   SHINGRIX injection    SPIKEVAX syringe Inject 0.5 mLs into the muscle once.   No current facility-administered medications for this visit. (Other)   REVIEW OF SYSTEMS: ROS   Positive for: Gastrointestinal, Genitourinary, Musculoskeletal, Eyes Negative for: Constitutional, Neurological, Skin, HENT, Endocrine, Cardiovascular, Respiratory, Psychiatric, Allergic/Imm, Heme/Lymph Last edited by Charlette Caffey, COT on 11/15/2022  9:34 AM.      ALLERGIES Allergies  Allergen Reactions   Ciprofloxacin    Diflucan [Fluconazole]    Sulfa Antibiotics    PAST MEDICAL HISTORY Past Medical History:  Diagnosis Date   Arthritis    Chronic constipation    CKD (chronic kidney disease)    Gallstones    History of kidney stones    Interstitial cystitis    Thyroid disease    UTI (urinary tract infection)    Past Surgical History:  Procedure Laterality Date   CATARACT EXTRACTION Bilateral    CHOLECYSTECTOMY     EYE SURGERY Bilateral  Cat Sx   PARTIAL HYSTERECTOMY     TUBAL LIGATION     FAMILY HISTORY Family History  Problem Relation Age of Onset   Cancer Father    Diabetes Father    Prostate cancer Brother    Colon cancer Paternal Uncle    SOCIAL HISTORY Social History   Tobacco Use   Smoking status: Never   Smokeless tobacco: Never  Vaping Use   Vaping Use: Never used  Substance Use Topics   Alcohol use: Yes    Alcohol/week: 2.0 standard drinks of alcohol    Types: 2 Glasses of wine per week    Comment: weekly   Drug use: Never       OPHTHALMIC EXAM:  Base Eye Exam     Visual Acuity (Snellen -  Linear)       Right Left   Dist [place] 20/20 20/20 +2         Tonometry (Tonopen, 9:37 AM)       Right Left   Pressure 15 18         Pupils       Dark Light Shape React APD   Right 2 1 Round Minimal None   Left 2 1 Round Minimal None         Visual Fields       Left Right    Full Full         Extraocular Movement       Right Left    Full, Ortho Full, Ortho         Neuro/Psych     Oriented x3: Yes   Mood/Affect: Normal         Dilation     Both eyes: 1.0% Mydriacyl, 2.5% Phenylephrine @ 9:34 AM           Slit Lamp and Fundus Exam     Slit Lamp Exam       Right Left   Lids/Lashes Dermatochalasis, Meibomian gland dysfunction Dermatochalasis, mild Meibomian gland dysfunction   Conjunctiva/Sclera white and quiet white and quiet   Cornea mild arcus, trace Punctate epithelial erosions mild arcus, well healed cataract wound   Anterior Chamber deep and clear deep and clear   Iris round and poorly dilated to 4mm Round and moderately dilated to 5.68mm   Lens well centered PCIOL, 1+PCO nasally well centered PCIOL, good position   Anterior Vitreous syneresis, PVD, vitreous condensations inferiorly syneresis, Posterior vitreous detachment, vitreous condensations inferiorly         Fundus Exam       Right Left   Disc mild pallor, sharp rim, +PPA mild pallor, sharp rim, mild temporal PPA   C/D Ratio 0.1 0.2   Macula Flat, blunted foveal reflex, mild RPE mottling and clumping, mlld ERM with trace cystic changes -- stable, no heme Flat, blunted foveal reflex, mild RPE mottling and clumping, mild ERM with trace cystic changes -- stable, no heme   Vessels attenuated, Tortuous attenuated, mild tortuosity   Periphery Attached, mild reticular degeneration, no heme attached, no heme           IMAGING AND PROCEDURES  Imaging and Procedures for 11/15/2022  OCT, Retina - OU - Both Eyes       Right Eye Quality was good. Central Foveal Thickness: 291.  Progression has been stable. Findings include no SRF, abnormal foveal contour, epiretinal membrane, intraretinal fluid, vitreomacular adhesion (Persistent ERM with cystic changes temporal fovea ).   Left Eye Quality was borderline. Central  Foveal Thickness: 300. Progression has been stable. Findings include no SRF, abnormal foveal contour, epiretinal membrane, intraretinal fluid.   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 CME - cystoid macular edema. PED - pigment epithelial detachment. IRF - intraretinal fluid. SRF - subretinal fluid. EZ - ellipsoid zone. ERM - epiretinal membrane. ORA - outer retinal atrophy. ORT - outer retinal tubulation. SRHM - subretinal hyper-reflective material. IRHM - intraretinal hyper-reflective material            ASSESSMENT/PLAN:    ICD-10-CM   1. 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 Essential hypertension  I10     4. Hypertensive retinopathy of both eyes  H35.033     5. Pseudophakia of both eyes  Z96.1       1-3. 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 Hypertensive retinopathy OU - discussed importance of tight BP control - monitor   6. Pseudophakia OU  - s/p CE/IOL OU (in [place])  - IOLs in good position, doing well  - monitor   Ophthalmic Meds Ordered this visit:  No orders of  the defined types were placed in this encounter.  This document serves as a record of services personally performed by Karie Chimera, MD, PhD. It was created on their behalf by De Blanch, an ophthalmic technician. The creation of this record is the provider's dictation and/or activities during the visit.    Electronically signed by: De Blanch, OA, 11/17/22  2:17 AM  This document serves as a record of services personally performed by Karie Chimera, MD, PhD. It was created on their behalf by Glee Arvin. Manson Passey, OA an ophthalmic technician. The creation of this record is the provider's dictation and/or activities during the visit.    Electronically signed by: Glee Arvin. Manson Passey, OA 11/17/22 2:17 AM  Karie Chimera, M.D., Ph.D. Diseases & Surgery of the Retina and Vitreous [facility]  I have reviewed the above documentation for accuracy and completeness, and I agree with the above. Karie Chimera, M.D., Ph.D. 11/17/22 2:18 AM   Abbreviations: M myopia (nearsighted); A astigmatism; H hyperopia (farsighted); P presbyopia; Mrx spectacle prescription;  CTL contact lenses; OD right eye; OS left eye; OU both eyes  XT exotropia; ET esotropia; PEK punctate epithelial keratitis; PEE punctate epithelial erosions; DES dry eye syndrome; MGD meibomian gland dysfunction; ATs artificial tears; PFAT's preservative free artificial tears; NSC nuclear sclerotic cataract; PSC posterior subcapsular cataract; ERM epi-retinal membrane; PVD posterior vitreous detachment; RD retinal detachment; DM diabetes mellitus; DR diabetic retinopathy; NPDR non-proliferative diabetic retinopathy; PDR proliferative diabetic retinopathy; CSME clinically significant macular edema; DME diabetic macular edema; dbh dot blot hemorrhages; CWS cotton wool spot; POAG primary open angle glaucoma; C/D cup-to-disc ratio; HVF humphrey visual  field; GVF goldmann visual field; OCT optical coherence tomography; IOP  intraocular pressure; BRVO Branch retinal vein occlusion; CRVO central retinal vein occlusion; CRAO central retinal artery occlusion; BRAO branch retinal artery occlusion; RT retinal tear; SB scleral buckle; PPV pars plana vitrectomy; VH Vitreous hemorrhage; PRP panretinal laser photocoagulation; IVK intravitreal kenalog; VMT vitreomacular traction; MH Macular hole;  NVD neovascularization of the disc; NVE neovascularization elsewhere; AREDS age related eye disease study; ARMD age related macular degeneration; POAG primary open angle glaucoma; EBMD epithelial/anterior basement membrane dystrophy; ACIOL anterior chamber intraocular lens; IOL intraocular lens; PCIOL posterior chamber intraocular lens; Phaco/IOL phacoemulsification with intraocular lens placement; PRK photorefractive keratectomy; LASIK laser assisted in situ keratomileusis; HTN hypertension; DM diabetes mellitus; COPD chronic obstructive pulmonary disease

## 2022-11-14 ENCOUNTER — Other Ambulatory Visit: Payer: Self-pay

## 2022-11-14 DIAGNOSIS — Z0000 Encounter for general adult medical examination without abnormal findings: Secondary | ICD-10-CM

## 2022-11-15 ENCOUNTER — Ambulatory Visit (INDEPENDENT_AMBULATORY_CARE_PROVIDER_SITE_OTHER): Payer: Medicare HMO | Admitting: Ophthalmology

## 2022-11-15 ENCOUNTER — Encounter (INDEPENDENT_AMBULATORY_CARE_PROVIDER_SITE_OTHER): Payer: Self-pay | Admitting: Ophthalmology

## 2022-11-15 DIAGNOSIS — Z961 Presence of intraocular lens: Secondary | ICD-10-CM

## 2022-11-15 DIAGNOSIS — H35033 Hypertensive retinopathy, bilateral: Secondary | ICD-10-CM

## 2022-11-15 DIAGNOSIS — H33193 Other retinoschisis and retinal cysts, bilateral: Secondary | ICD-10-CM

## 2022-11-15 DIAGNOSIS — I10 Essential (primary) hypertension: Secondary | ICD-10-CM

## 2022-11-15 DIAGNOSIS — H35373 Puckering of macula, bilateral: Secondary | ICD-10-CM | POA: Diagnosis not present

## 2022-11-17 ENCOUNTER — Encounter (INDEPENDENT_AMBULATORY_CARE_PROVIDER_SITE_OTHER): Payer: Self-pay | Admitting: Ophthalmology

## 2022-12-19 ENCOUNTER — Other Ambulatory Visit: Payer: Self-pay | Admitting: Nurse Practitioner

## 2022-12-19 ENCOUNTER — Other Ambulatory Visit: Payer: Medicare HMO

## 2022-12-19 DIAGNOSIS — E559 Vitamin D deficiency, unspecified: Secondary | ICD-10-CM | POA: Diagnosis not present

## 2022-12-19 DIAGNOSIS — E039 Hypothyroidism, unspecified: Secondary | ICD-10-CM | POA: Diagnosis not present

## 2022-12-19 LAB — CBC WITH DIFFERENTIAL/PLATELET
Absolute Monocytes: 410 cells/uL (ref 200–950)
Basophils Absolute: 41 cells/uL (ref 0–200)
Basophils Relative: 0.9 %
Eosinophils Absolute: 122 cells/uL (ref 15–500)
Eosinophils Relative: 2.7 %
HCT: 40.7 % (ref 35.0–45.0)
Hemoglobin: 13.9 g/dL (ref 11.7–15.5)
Lymphs Abs: 1445 cells/uL (ref 850–3900)
MCH: 31.1 pg (ref 27.0–33.0)
MCHC: 34.2 g/dL (ref 32.0–36.0)
MCV: 91.1 fL (ref 80.0–100.0)
MPV: 10.5 fL (ref 7.5–12.5)
Monocytes Relative: 9.1 %
Neutro Abs: 2484 cells/uL (ref 1500–7800)
Neutrophils Relative %: 55.2 %
Platelets: 241 10*3/uL (ref 140–400)
RBC: 4.47 10*6/uL (ref 3.80–5.10)
RDW: 12.4 % (ref 11.0–15.0)
Total Lymphocyte: 32.1 %
WBC: 4.5 10*3/uL (ref 3.8–10.8)

## 2022-12-19 LAB — COMPLETE METABOLIC PANEL WITH GFR
AG Ratio: 1.7 (calc) (ref 1.0–2.5)
ALT: 25 U/L (ref 6–29)
AST: 25 U/L (ref 10–35)
Albumin: 4.3 g/dL (ref 3.6–5.1)
Alkaline phosphatase (APISO): 76 U/L (ref 37–153)
BUN: 13 mg/dL (ref 7–25)
CO2: 24 mmol/L (ref 20–32)
Calcium: 9.3 mg/dL (ref 8.6–10.4)
Chloride: 102 mmol/L (ref 98–110)
Creat: 0.74 mg/dL (ref 0.60–0.95)
Globulin: 2.5 g/dL (calc) (ref 1.9–3.7)
Glucose, Bld: 91 mg/dL (ref 65–99)
Potassium: 4.5 mmol/L (ref 3.5–5.3)
Sodium: 139 mmol/L (ref 135–146)
Total Bilirubin: 0.4 mg/dL (ref 0.2–1.2)
Total Protein: 6.8 g/dL (ref 6.1–8.1)
eGFR: 80 mL/min/{1.73_m2} (ref >=60)

## 2022-12-19 LAB — LIPID PANEL
Cholesterol: 187 mg/dL (ref <200)
HDL: 61 mg/dL (ref >=50)
LDL Cholesterol (Calc): 106 mg/dL (calc) — ABNORMAL HIGH
Non-HDL Cholesterol (Calc): 126 mg/dL (calc) (ref <130)
Total CHOL/HDL Ratio: 3.1 (calc) (ref <5.0)
Triglycerides: 105 mg/dL (ref <150)

## 2022-12-19 LAB — TSH: TSH: 4.65 mIU/L — ABNORMAL HIGH (ref 0.40–4.50)

## 2022-12-23 ENCOUNTER — Other Ambulatory Visit: Payer: Self-pay | Admitting: Nurse Practitioner

## 2022-12-23 DIAGNOSIS — E039 Hypothyroidism, unspecified: Secondary | ICD-10-CM

## 2022-12-25 ENCOUNTER — Encounter: Payer: Self-pay | Admitting: Nurse Practitioner

## 2022-12-25 DIAGNOSIS — R7303 Prediabetes: Secondary | ICD-10-CM | POA: Insufficient documentation

## 2022-12-29 ENCOUNTER — Encounter: Payer: Self-pay | Admitting: Family Medicine

## 2022-12-29 ENCOUNTER — Other Ambulatory Visit: Payer: Self-pay | Admitting: Nurse Practitioner

## 2023-01-01 ENCOUNTER — Encounter: Payer: Self-pay | Admitting: Family Medicine

## 2023-02-16 ENCOUNTER — Ambulatory Visit
Admission: RE | Admit: 2023-02-16 | Discharge: 2023-02-16 | Disposition: A | Discharge status: Home / Self Care | Payer: Medicare HMO | Source: Ambulatory Visit | Attending: Nurse Practitioner | Admitting: Nurse Practitioner

## 2023-02-16 DIAGNOSIS — M8588 Other specified disorders of bone density and structure, other site: Secondary | ICD-10-CM | POA: Diagnosis not present

## 2023-02-16 DIAGNOSIS — E349 Endocrine disorder, unspecified: Secondary | ICD-10-CM | POA: Diagnosis not present

## 2023-02-16 DIAGNOSIS — N958 Other specified menopausal and perimenopausal disorders: Secondary | ICD-10-CM | POA: Diagnosis not present

## 2023-02-22 ENCOUNTER — Encounter: Payer: Self-pay | Admitting: Nurse Practitioner

## 2023-02-22 DIAGNOSIS — M8580 Other specified disorders of bone density and structure, unspecified site: Secondary | ICD-10-CM | POA: Insufficient documentation

## 2023-03-19 ENCOUNTER — Other Ambulatory Visit: Payer: Self-pay | Admitting: Nurse Practitioner

## 2023-03-19 DIAGNOSIS — E039 Hypothyroidism, unspecified: Secondary | ICD-10-CM

## 2023-04-03 ENCOUNTER — Other Ambulatory Visit: Payer: Self-pay | Admitting: Nurse Practitioner

## 2023-04-03 NOTE — Telephone Encounter (Signed)
High Risk Warning Populated when attempting to refill, I will send to Provider for further review 

## 2023-04-26 ENCOUNTER — Encounter: Payer: Self-pay | Admitting: Nurse Practitioner

## 2023-04-26 ENCOUNTER — Non-Acute Institutional Stay: Payer: Medicare HMO | Admitting: Nurse Practitioner

## 2023-04-26 VITALS — BP 132/78 | HR 87 | Temp 97.5°F | Resp 17 | Ht 62.0 in | Wt 143.2 lb

## 2023-04-26 DIAGNOSIS — K5901 Slow transit constipation: Secondary | ICD-10-CM

## 2023-04-26 DIAGNOSIS — Z66 Do not resuscitate: Secondary | ICD-10-CM | POA: Diagnosis not present

## 2023-04-26 DIAGNOSIS — E039 Hypothyroidism, unspecified: Secondary | ICD-10-CM | POA: Diagnosis not present

## 2023-04-26 DIAGNOSIS — E559 Vitamin D deficiency, unspecified: Secondary | ICD-10-CM | POA: Diagnosis not present

## 2023-04-26 DIAGNOSIS — R7303 Prediabetes: Secondary | ICD-10-CM | POA: Diagnosis not present

## 2023-04-26 DIAGNOSIS — K219 Gastro-esophageal reflux disease without esophagitis: Secondary | ICD-10-CM

## 2023-04-26 DIAGNOSIS — E785 Hyperlipidemia, unspecified: Secondary | ICD-10-CM | POA: Diagnosis not present

## 2023-04-26 DIAGNOSIS — M8580 Other specified disorders of bone density and structure, unspecified site: Secondary | ICD-10-CM | POA: Diagnosis not present

## 2023-04-26 DIAGNOSIS — Z780 Asymptomatic menopausal state: Secondary | ICD-10-CM | POA: Diagnosis not present

## 2023-04-26 DIAGNOSIS — N952 Postmenopausal atrophic vaginitis: Secondary | ICD-10-CM | POA: Diagnosis not present

## 2023-04-26 NOTE — Assessment & Plan Note (Signed)
stable, off Famotidine 20mg  bid. Hgb 13.9 12/19/22 F/u CBC/diff 10/30/23

## 2023-04-26 NOTE — Assessment & Plan Note (Signed)
takes D, Vit D level 37 12/19/22 Obtain Vit D level 10/30/23

## 2023-04-26 NOTE — Assessment & Plan Note (Addendum)
11/14/22 DEXA t score -1.9 OP Ca and Vit D supplement. Hx of Fosamax didn't tolerate, GI symptoms.

## 2023-04-26 NOTE — Assessment & Plan Note (Signed)
on Levothyroxine qd, TSH 4.65 12/19/22 F/u TSH, Hgb A1c, CMP/eGFR 10/30/23

## 2023-04-26 NOTE — Assessment & Plan Note (Signed)
managed with Estradiol 0.25mg qd.     

## 2023-04-26 NOTE — Assessment & Plan Note (Signed)
takes Fish oil, ASA 81mg  qd, LDL 106 12/19/22 F/u lipids 10/30/23

## 2023-04-26 NOTE — Progress Notes (Signed)
Location:   Clinic [facility]   Place of Service:  Clinic (12) Provider: Chipper Oman NP  Code Status: DNR Goals of Care:     04/26/2023   12:55 PM  Advanced Directives  Does Patient Have a Medical Advance Directive? Yes  Type of Estate agent of [place];Out of facility DNR (pink MOST or yellow form)  Does patient want to make changes to medical advance directive? No - Patient declined  Copy of Healthcare Power of Attorney in Chart? Yes - validated most recent copy scanned in chart (See row information)  Pre-existing out of facility DNR order (yellow form or pink MOST form) Yellow form placed in chart (order not valid for inpatient use);Pink MOST form placed in chart (order not valid for inpatient use)     Chief Complaint  Patient presents with  . Medical Management of Chronic Issues    12month follow up and discuss covid and flu vaccines. [facility] verified.     HPI: Patient is a 82 y.o. female seen today for medical management of chronic diseases.    Prediabetes Hgb A1c 5.8 12/19/22 GERD, stable, off Famotidine 20mg  bid. Hgb 13.9 12/19/22             Postmenopausal symptoms, managed with Estradiol 0.25mg  qd.        Hypothyroidism, on Levothyroxine qd, TSH 4.65 12/19/22             Hyperlipidemia, takes Fish oil, ASA 81mg  qd, LDL 106 12/19/22             Constipation, MiraLax qod, feels floated sometimes, Linzess is too strong.              Vitamin D deficiency, takes D, Vit D level 37 12/19/22  OP DEXA t score -1.9 11/14/22, continue Ca, Vit D,   Hx of Fosamax didn't tolerate, GI symptoms.    Past Medical History:  Diagnosis Date  . Arthritis   . Chronic constipation   . CKD (chronic kidney disease)   . Gallstones   . History of kidney stones   . Interstitial cystitis   . Thyroid disease   . UTI (urinary tract infection)     Past Surgical History:  Procedure Laterality Date  . CATARACT EXTRACTION Bilateral   . CHOLECYSTECTOMY    . EYE SURGERY  Bilateral    Cat Sx  . PARTIAL HYSTERECTOMY    . TUBAL LIGATION      Allergies  Allergen Reactions  . Ciprofloxacin   . Diflucan [Fluconazole]   . Sulfa Antibiotics     Allergies as of 04/26/2023       Reactions   Ciprofloxacin    Diflucan [fluconazole]    Sulfa Antibiotics         Medication List        Accurate as of April 26, 2023 11:59 PM. If you have any questions, ask your nurse or doctor.          Abrysvo 120 MCG/0.5ML injection Generic drug: RSV bivalent vaccine Inject 0.5 mLs into the muscle once.   amoxicillin 500 MG capsule Commonly known as: AMOXIL Take 500 mg by mouth 3 (three) times daily.   aspirin EC 81 MG tablet Take 81 mg by mouth daily.   calcium carbonate 1500 (600 Ca) MG Tabs tablet Commonly known as: OSCAL Take 600 mg of elemental calcium by mouth daily with breakfast.   EQL Vitamin D3 50 MCG (2000 UT) Caps Generic drug: Cholecalciferol Take 2,000 Units  by mouth daily.   estradiol 1 MG tablet Commonly known as: ESTRACE TAKE 1 TABLET BY MOUTH THREE TIMES A WEEK   FISH OIL PO Take 2,000 mg by mouth daily.   GLUCOSAMINE 1500 COMPLEX PO Take 1,500 mg by mouth daily.   levothyroxine 75 MCG tablet Commonly known as: SYNTHROID TAKE 1/2 (ONE-HALF) TABLET BY MOUTH ONCE DAILY BEFORE BREAKFAST   meloxicam 7.5 MG tablet Commonly known as: MOBIC Take 1 tablet (7.5 mg total) by mouth daily.   miconazole 2 % vaginal cream Commonly known as: MONISTAT 7 Place 1 Applicatorful vaginally at bedtime.   MULTIVITAMIN ADULT PO Take 20 mg by mouth.   multivitamin-lutein Caps capsule Take 1 capsule by mouth daily.   polyethylene glycol 17 g packet Commonly known as: MIRALAX / GLYCOLAX Take 17 g by mouth. Every other night   Shingrix injection Generic drug: Zoster Vaccine Adjuvanted   Spikevax syringe Generic drug: COVID-19 mRNA vaccine Inject 0.5 mLs into the muscle once.        Review of Systems:  Review of Systems   Constitutional:  Negative for activity change, appetite change and fever.  HENT:  Positive for hearing loss. Negative for congestion and voice change.   Eyes:  Negative for visual disturbance.  Respiratory:  Negative for cough and shortness of breath.   Cardiovascular:  Negative for leg swelling.  Gastrointestinal:  Negative for abdominal pain, constipation, nausea and vomiting.       Bloated after Laxatives.   Genitourinary:  Negative for difficulty urinating, dysuria, urgency and vaginal pain.       Urination x1/night.   Musculoskeletal:  Negative for gait problem.  Skin:  Negative for color change.  Neurological:  Negative for speech difficulty, weakness and headaches.  Psychiatric/Behavioral:  Negative for behavioral problems and sleep disturbance. The patient is not nervous/anxious.        5 hours average sleep at night.     Health Maintenance  Topic Date Due  . COVID-19 Vaccine (9 - 2023-24 season) 05/01/2023  . Medicare Annual Wellness (AWV)  11/14/2023  . DTaP/Tdap/Td (2 - Td or Tdap) 05/22/2030  . Pneumonia Vaccine 35+ Years old  Completed  . INFLUENZA VACCINE  Completed  . DEXA SCAN  Completed  . Zoster Vaccines- Shingrix  Completed  . HPV VACCINES  Aged Out    Physical Exam: Vitals:   04/26/23 1249  BP: 132/78  Pulse: 87  Resp: 17  Temp: (!) 97.5 F (36.4 C)  SpO2: 97%  Weight: 143 lb 3.2 oz (65 kg)  Height: 5\' 2"  (1.575 m)   Body mass index is 26.19 kg/m. Physical Exam Vitals and nursing note reviewed.  Constitutional:      Appearance: Normal appearance.     Comments: Over weight.   HENT:     Head: Normocephalic and atraumatic.     Mouth/Throat:     Mouth: Mucous membranes are moist.  Eyes:     Extraocular Movements: Extraocular movements intact.     Conjunctiva/sclera: Conjunctivae normal.     Pupils: Pupils are equal, round, and reactive to light.  Cardiovascular:     Rate and Rhythm: Normal rate and regular rhythm.     Heart sounds: No murmur  heard. Pulmonary:     Effort: Pulmonary effort is normal.     Breath sounds: No rales.  Abdominal:     Palpations: Abdomen is soft.     Tenderness: There is no abdominal tenderness.     Comments: Shifting dullness on  percussion.   Musculoskeletal:     Cervical back: Normal range of motion and neck supple.     Right lower leg: No edema.     Left lower leg: No edema.  Skin:    General: Skin is warm and dry.  Neurological:     General: No focal deficit present.     Mental Status: She is alert and oriented to person, place, and time. Mental status is at baseline.     Gait: Gait normal.  Psychiatric:        Mood and Affect: Mood normal.        Behavior: Behavior normal.        Thought Content: Thought content normal.        Judgment: Judgment normal.    Labs reviewed: Basic Metabolic Panel: Recent Labs    12/19/22 0711  NA 139  K 4.5  CL 102  CO2 24  GLUCOSE 91  BUN 13  CREATININE 0.74  CALCIUM 9.3  TSH 4.65*   Liver Function Tests: Recent Labs    12/19/22 0711  AST 25  ALT 25  BILITOT 0.4  PROT 6.8   No results for input(s): "LIPASE", "AMYLASE" in the last 8760 hours. No results for input(s): "AMMONIA" in the last 8760 hours. CBC: Recent Labs    12/19/22 0711  WBC 4.5  NEUTROABS 2,484  HGB 13.9  HCT 40.7  MCV 91.1  PLT 241   Lipid Panel: Recent Labs    12/19/22 0711  CHOL 187  HDL 61  LDLCALC 106*  TRIG 105  CHOLHDL 3.1   Lab Results  Component Value Date   HGBA1C 5.8 (H) 12/19/2022    Procedures since last visit: No results found.  Assessment/Plan  GERD (gastroesophageal reflux disease) stable, off Famotidine 20mg  bid. Hgb 13.9 12/19/22 F/u CBC/diff 10/30/23  Atrophic vaginitis  managed with Estradiol 0.25mg  qd.         Hypothyroidism on Levothyroxine qd, TSH 4.65 12/19/22 F/u TSH, Hgb A1c, CMP/eGFR 10/30/23  Hyperlipidemia  takes Fish oil, ASA 81mg  qd, LDL 106 12/19/22 F/u lipids 10/30/23  Slow transit constipation   MiraLax qod, feels floated sometimes, Linzess is too strong.   Vitamin D deficiency takes D, Vit D level 37 12/19/22 Obtain Vit D level 10/30/23  Prediabetes Hgb A1c 5.8 12/19/22, update Hgb A1c prior to the next appointment  Osteopenia after menopause 11/14/22 DEXA t score -1.9 OP Ca and Vit D supplement. Hx of Fosamax didn't tolerate, GI symptoms.    Labs/tests ordered:  CBC/diff, CMP/eGFR, Hgb A1c, lipids, Vit D, TSH  Next appt:  6 months.

## 2023-04-26 NOTE — Assessment & Plan Note (Signed)
MiraLax qod, feels floated sometimes, Linzess is too strong.

## 2023-04-26 NOTE — Assessment & Plan Note (Signed)
Hgb A1c 5.8 12/19/22, update Hgb A1c prior to the next appointment

## 2023-04-27 ENCOUNTER — Encounter: Payer: Self-pay | Admitting: Nurse Practitioner

## 2023-06-21 ENCOUNTER — Other Ambulatory Visit: Payer: Self-pay | Admitting: Nurse Practitioner

## 2023-07-03 DIAGNOSIS — D1801 Hemangioma of skin and subcutaneous tissue: Secondary | ICD-10-CM | POA: Diagnosis not present

## 2023-07-03 DIAGNOSIS — L821 Other seborrheic keratosis: Secondary | ICD-10-CM | POA: Diagnosis not present

## 2023-07-03 DIAGNOSIS — L820 Inflamed seborrheic keratosis: Secondary | ICD-10-CM | POA: Diagnosis not present

## 2023-09-11 ENCOUNTER — Other Ambulatory Visit: Payer: Self-pay | Admitting: Nurse Practitioner

## 2023-09-16 ENCOUNTER — Other Ambulatory Visit: Payer: Self-pay | Admitting: Nurse Practitioner

## 2023-09-16 DIAGNOSIS — E039 Hypothyroidism, unspecified: Secondary | ICD-10-CM

## 2023-10-30 ENCOUNTER — Other Ambulatory Visit: Payer: Medicare HMO

## 2023-11-01 ENCOUNTER — Encounter: Payer: Medicare HMO | Admitting: Nurse Practitioner

## 2023-11-14 NOTE — Progress Notes (Shared)
 [facility] - Clinic Note  11/21/2023     CHIEF COMPLAINT Patient presents for No chief complaint on file.    HISTORY OF PRESENT ILLNESS: Claudia Hunt is a 83 y.o. female who presents to the clinic today for:    Pt states   Referring physician: Mast, Man X, NP [address] [place],  [place] [address]  HISTORICAL INFORMATION:   Selected notes from the MEDICAL RECORD NUMBER Referred by Dr. Charlanne   CURRENT MEDICATIONS: No current outpatient medications on file. (Ophthalmic Drugs)   No current facility-administered medications for this visit. (Ophthalmic Drugs)   Current Outpatient Medications (Other)  Medication Sig   ABRYSVO 120 MCG/0.5ML injection Inject 0.5 mLs into the muscle once. (Patient not taking: Reported on 04/26/2023)   amoxicillin (AMOXIL) 500 MG capsule Take 500 mg by mouth 3 (three) times daily. (Patient not taking: Reported on 04/26/2023)   aspirin EC 81 MG tablet Take 81 mg by mouth daily.   calcium carbonate (OSCAL) 1500 (600 Ca) MG TABS tablet Take 600 mg of elemental calcium by mouth daily with breakfast.   Cholecalciferol (EQL VITAMIN D3) 50 MCG (2000 UT) CAPS Take 2,000 Units by mouth daily.   estradiol  (ESTRACE ) 1 MG tablet TAKE 1 TABLET BY MOUTH THREE TIMES A WEEK   Glucosamine-Chondroit-Vit C-Mn (GLUCOSAMINE 1500 COMPLEX PO) Take 1,500 mg by mouth daily.   levothyroxine  (SYNTHROID ) 75 MCG tablet TAKE 1/2 (ONE-HALF) TABLET BY MOUTH ONCE DAILY BEFORE BREAKFAST   meloxicam  (MOBIC ) 7.5 MG tablet Take 1 tablet (7.5 mg total) by mouth daily. (Patient not taking: Reported on 04/26/2023)   miconazole  (MONISTAT  7) 2 % vaginal cream Place 1 Applicatorful vaginally at bedtime. (Patient not taking: Reported on 04/26/2023)   Multiple Vitamin (MULTIVITAMIN ADULT PO) Take 20 mg by mouth.   multivitamin-lutein (OCUVITE-LUTEIN) CAPS capsule Take 1 capsule by mouth daily.   Omega-3 Fatty Acids (FISH OIL PO) Take 2,000 mg by mouth daily.    polyethylene glycol (MIRALAX / GLYCOLAX) 17 g packet Take 17 g by mouth. Every other night   SHINGRIX injection    SPIKEVAX syringe Inject 0.5 mLs into the muscle once.   No current facility-administered medications for this visit. (Other)   REVIEW OF SYSTEMS:    ALLERGIES Allergies  Allergen Reactions   Ciprofloxacin    Diflucan [Fluconazole]    Sulfa Antibiotics    PAST MEDICAL HISTORY Past Medical History:  Diagnosis Date   Arthritis    Chronic constipation    CKD (chronic kidney disease)    Gallstones    History of kidney stones    Interstitial cystitis    Thyroid disease    UTI (urinary tract infection)    Past Surgical History:  Procedure Laterality Date   CATARACT EXTRACTION Bilateral    CHOLECYSTECTOMY     EYE SURGERY Bilateral    Cat Sx   PARTIAL HYSTERECTOMY     TUBAL LIGATION     FAMILY HISTORY Family History  Problem Relation Age of Onset   Cancer Father    Diabetes Father    Prostate cancer Brother    Colon cancer Paternal Uncle    SOCIAL HISTORY Social History   Tobacco Use   Smoking status: Never   Smokeless tobacco: Never  Vaping Use   Vaping status: Never Used  Substance Use Topics   Alcohol use: Yes    Alcohol/week: 2.0 standard drinks of alcohol    Types: 2 Glasses of wine per week  Comment: weekly   Drug use: Never       OPHTHALMIC EXAM:  Not recorded    IMAGING AND PROCEDURES  Imaging and Procedures for 11/21/2023          ASSESSMENT/PLAN:  No diagnosis found.   1-3. 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 Hypertensive retinopathy  OU - discussed importance of tight BP control - monitor   6. Pseudophakia OU  - s/p CE/IOL OU (in [place])  - IOLs in good position, doing well  - monitor   Ophthalmic Meds Ordered this visit:  No orders of the defined types were placed in this encounter.  This document serves as a record of services personally performed by Redell JUDITHANN Hans, MD, PhD. It was created on their behalf by Almetta Pesa, an ophthalmic technician. The creation of this record is the provider's dictation and/or activities during the visit.    Electronically signed by: Almetta Pesa, OA, 11/14/23  8:29 AM    Redell JUDITHANN Hans, M.D., Ph.D. Diseases & Surgery of the Retina and Vitreous [facility]    Abbreviations: M myopia (nearsighted); A astigmatism; H hyperopia (farsighted); P presbyopia; Mrx spectacle prescription;  CTL contact lenses; OD right eye; OS left eye; OU both eyes  XT exotropia; ET esotropia; PEK punctate epithelial keratitis; PEE punctate epithelial erosions; DES dry eye syndrome; MGD meibomian gland dysfunction; ATs artificial tears; PFAT's preservative free artificial tears; NSC nuclear sclerotic cataract; PSC posterior subcapsular cataract; ERM epi-retinal membrane; PVD posterior vitreous detachment; RD retinal detachment; DM diabetes mellitus; DR diabetic retinopathy; NPDR non-proliferative diabetic retinopathy; PDR proliferative diabetic retinopathy; CSME clinically significant macular edema; DME diabetic macular edema; dbh dot blot hemorrhages; CWS cotton wool spot; POAG primary open angle glaucoma; C/D cup-to-disc ratio; HVF humphrey visual field; GVF goldmann visual field; OCT optical coherence tomography; IOP intraocular pressure; BRVO Branch retinal vein occlusion; CRVO central retinal vein occlusion; CRAO central retinal artery occlusion; BRAO branch retinal artery occlusion; RT retinal tear; SB scleral buckle; PPV pars plana vitrectomy; VH Vitreous hemorrhage; PRP  panretinal laser photocoagulation; IVK intravitreal kenalog; VMT vitreomacular traction; MH Macular hole;  NVD neovascularization of the disc; NVE neovascularization elsewhere; AREDS age related eye disease study; ARMD age related macular degeneration; POAG primary open angle glaucoma; EBMD epithelial/anterior basement membrane dystrophy; ACIOL anterior chamber intraocular lens; IOL intraocular lens; PCIOL posterior chamber intraocular lens; Phaco/IOL phacoemulsification with intraocular lens placement; PRK photorefractive keratectomy; LASIK laser assisted in situ keratomileusis; HTN hypertension; DM diabetes mellitus; COPD chronic obstructive pulmonary disease

## 2023-11-20 NOTE — Progress Notes (Signed)
 [facility] - Clinic Note  11/28/2023     CHIEF COMPLAINT Patient presents for Retina Follow Up    HISTORY OF PRESENT ILLNESS: Claudia Hunt is a 83 y.o. female who presents to the clinic today for:   HPI     Retina Follow Up   In both eyes.  This started 1 year ago.  Duration of 1 year.  Since onset it is stable.  I, the attending physician,  performed the HPI with the patient and updated documentation appropriately.        Comments   1 year retina follow up ERM OU pt is reporting no vision changes noticed she denies any flashes or floaters       Last edited by Valdemar Rogue, MD on 12/02/2023  3:36 PM.     Pt states she is not having any problems with her vision  Referring physician: Mast, Man X, NP [address] [place],  [place] [address]  HISTORICAL INFORMATION:   Selected notes from the MEDICAL RECORD NUMBER Referred by Dr. Charlanne   CURRENT MEDICATIONS: No current outpatient medications on file. (Ophthalmic Drugs)   No current facility-administered medications for this visit. (Ophthalmic Drugs)   Current Outpatient Medications (Other)  Medication Sig   amoxicillin (AMOXIL) 500 MG capsule Take 500 mg by mouth 3 (three) times daily.   calcium carbonate (OSCAL) 1500 (600 Ca) MG TABS tablet Take 600 mg of elemental calcium by mouth daily with breakfast.   Cholecalciferol (EQL VITAMIN D3) 50 MCG (2000 UT) CAPS Take 2,000 Units by mouth daily.   estradiol  (ESTRACE ) 1 MG tablet TAKE 1 TABLET BY MOUTH THREE TIMES A WEEK   Lactulose  20 GM/30ML SOLN Take 30 mLs (20 g total) by mouth daily.   levothyroxine  (SYNTHROID ) 75 MCG tablet TAKE 1/2 (ONE-HALF) TABLET BY MOUTH ONCE DAILY BEFORE BREAKFAST   Multiple Vitamin (MULTIVITAMIN ADULT PO) Take 20 mg by mouth.   multivitamin-lutein (OCUVITE-LUTEIN) CAPS capsule Take 1 capsule by mouth daily.   Omega-3 Fatty Acids (FISH OIL PO) Take 2,000 mg by mouth daily.   polyethylene glycol (MIRALAX / GLYCOLAX) 17  g packet Take 17 g by mouth. Every other night   No current facility-administered medications for this visit. (Other)   REVIEW OF SYSTEMS: ROS   Positive for: Gastrointestinal, Genitourinary, Musculoskeletal, Eyes Negative for: Constitutional, Neurological, Skin, HENT, Endocrine, Cardiovascular, Respiratory, Psychiatric, Allergic/Imm, Heme/Lymph Last edited by Resa Delon ORN, COT on 11/28/2023  9:24 AM.     ALLERGIES Allergies  Allergen Reactions   Ciprofloxacin    Diflucan [Fluconazole]    Sulfa Antibiotics    PAST MEDICAL HISTORY Past Medical History:  Diagnosis Date   Arthritis    Chronic constipation    CKD (chronic kidney disease)    Gallstones    History of kidney stones    Interstitial cystitis    Thyroid disease    UTI (urinary tract infection)    Past Surgical History:  Procedure Laterality Date   CATARACT EXTRACTION Bilateral    CHOLECYSTECTOMY     EYE SURGERY Bilateral    Cat Sx   PARTIAL HYSTERECTOMY     TUBAL LIGATION     FAMILY HISTORY Family History  Problem Relation Age of Onset   Cancer Father    Diabetes Father    Prostate cancer Brother    Colon cancer Paternal Uncle    SOCIAL HISTORY Social History   Tobacco Use   Smoking status: Never   Smokeless tobacco:  Never  Vaping Use   Vaping status: Never Used  Substance Use Topics   Alcohol use: Yes    Alcohol/week: 3.0 standard drinks of alcohol    Types: 3 Glasses of wine per week    Comment: weekly   Drug use: Never       OPHTHALMIC EXAM:  Base Eye Exam     Visual Acuity (Snellen - Linear)       Right Left   Dist [place] 20/20 20/20         Tonometry (Tonopen, 9:28 AM)       Right Left   Pressure 13 16         Pupils       Pupils Dark Light Shape React APD   Right PERRL 2 1 Round Brisk None   Left PERRL 2 1 Round Brisk None         Visual Fields       Left Right    Full Full         Extraocular Movement       Right Left    Full, Ortho Full, Ortho          Neuro/Psych     Oriented x3: Yes   Mood/Affect: Normal         Dilation     Both eyes: 2.5% Phenylephrine @ 9:28 AM           Slit Lamp and Fundus Exam     Slit Lamp Exam       Right Left   Lids/Lashes Dermatochalasis, Meibomian gland dysfunction Dermatochalasis, mild Meibomian gland dysfunction   Conjunctiva/Sclera white and quiet white and quiet   Cornea arcus, trace Punctate epithelial erosions mild arcus, well healed cataract wound, trace PEE   Anterior Chamber deep and clear deep and clear   Iris round and poorly dilated round and poorly dilated   Lens well centered PCIOL, 1+PCO nasally well centered PCIOL, good position   Anterior Vitreous mild syneresis, PVD, vitreous condensations inferiorly mild syneresis, Posterior vitreous detachment, vitreous condensations inferiorly         Fundus Exam       Right Left   Disc mild pallor, sharp rim, +PPA mild pallor, sharp rim, mild temporal PPA   C/D Ratio 0.1 0.2   Macula Flat, blunted foveal reflex, mild drusen, mild RPE mottling and clumping, mlld ERM with trace cystic changes -- stable, no heme Flat, blunted foveal reflex, fine drusen, mild RPE mottling and clumping, mild ERM with trace cystic changes -- stable, no heme   Vessels attenuated, Tortuous attenuated, mild tortuosity   Periphery Attached, mild reticular degeneration, no heme attached, no heme, mild reticular degeneration           IMAGING AND PROCEDURES  Imaging and Procedures for 11/28/2023  OCT, Retina - OU - Both Eyes       Right Eye Quality was good. Central Foveal Thickness: 298. Progression has been stable. Findings include no SRF, abnormal foveal contour, epiretinal membrane, intraretinal fluid, vitreomacular adhesion (Persistent ERM with central cystic changes -- stable from prior).   Left Eye Quality was borderline. Central Foveal Thickness: 320. Progression has been stable. Findings include no SRF, abnormal foveal contour,  epiretinal membrane, intraretinal fluid (Mild ERM with lamellar hole and central cystic changes).   Notes *Images captured and stored on drive  Diagnosis / Impression:  Mild ERM with cystic changes / retinoschisis OU (OS>OD) -- stable  Clinical management:  See below  Abbreviations:  NFP - Normal foveal profile. CME - cystoid macular edema. PED - pigment epithelial detachment. IRF - intraretinal fluid. SRF - subretinal fluid. EZ - ellipsoid zone. ERM - epiretinal membrane. ORA - outer retinal atrophy. ORT - outer retinal tubulation. SRHM - subretinal hyper-reflective material. IRHM - intraretinal hyper-reflective material            ASSESSMENT/PLAN:    ICD-10-CM   1. 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 Essential hypertension  I10     4. Hypertensive retinopathy of both eyes  H35.033     5. Pseudophakia of both eyes  Z96.1      1-3. 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 Hypertensive retinopathy OU - discussed importance of tight BP control - monitor   6. Pseudophakia OU  - s/p CE/IOL OU (in [place])  - IOLs in good position, doing well  - monitor   Ophthalmic Meds Ordered this visit:  No orders of the defined types were placed in this encounter.  This document serves as a record of services personally performed by Redell JUDITHANN Hans, MD, PhD. It was created on their behalf by Almetta Pesa, an ophthalmic technician. The creation of this record is the provider's dictation and/or  activities during the visit.    Electronically signed by: Almetta Pesa, OA, 12/02/23  3:37 PM  Redell JUDITHANN Hans, M.D., Ph.D. Diseases & Surgery of the Retina and Vitreous [facility]  I have reviewed the above documentation for accuracy and completeness, and I agree with the above. Redell JUDITHANN Hans, M.D., Ph.D. 12/02/23 3:38 PM   Abbreviations: M myopia (nearsighted); A astigmatism; H hyperopia (farsighted); P presbyopia; Mrx spectacle prescription;  CTL contact lenses; OD right eye; OS left eye; OU both eyes  XT exotropia; ET esotropia; PEK punctate epithelial keratitis; PEE punctate epithelial erosions; DES dry eye syndrome; MGD meibomian gland dysfunction; ATs artificial tears; PFAT's preservative free artificial tears; NSC nuclear sclerotic cataract; PSC posterior subcapsular cataract; ERM epi-retinal membrane; PVD posterior vitreous detachment; RD retinal detachment; DM diabetes mellitus; DR diabetic retinopathy; NPDR non-proliferative diabetic retinopathy; PDR proliferative diabetic retinopathy; CSME clinically significant macular edema; DME diabetic macular edema; dbh dot blot hemorrhages; CWS cotton wool spot; POAG primary open angle glaucoma; C/D cup-to-disc ratio; HVF humphrey visual field; GVF goldmann visual field; OCT optical coherence tomography; IOP intraocular pressure; BRVO Branch retinal vein occlusion; CRVO central retinal vein occlusion; CRAO central retinal artery occlusion; BRAO branch retinal artery occlusion; RT retinal tear; SB scleral buckle; PPV pars plana vitrectomy; VH Vitreous hemorrhage; PRP panretinal laser photocoagulation; IVK intravitreal kenalog; VMT vitreomacular traction; MH Macular hole;  NVD neovascularization of the disc; NVE neovascularization elsewhere; AREDS age related eye disease study; ARMD age related macular degeneration; POAG primary open angle glaucoma; EBMD epithelial/anterior basement membrane dystrophy; ACIOL anterior chamber  intraocular lens; IOL intraocular lens; PCIOL posterior chamber intraocular lens; Phaco/IOL phacoemulsification with intraocular lens placement; PRK photorefractive  keratectomy; LASIK laser assisted in situ keratomileusis; HTN hypertension; DM diabetes mellitus; COPD chronic obstructive pulmonary disease

## 2023-11-21 ENCOUNTER — Encounter (INDEPENDENT_AMBULATORY_CARE_PROVIDER_SITE_OTHER): Payer: Medicare HMO | Admitting: Ophthalmology

## 2023-11-21 DIAGNOSIS — H35373 Puckering of macula, bilateral: Secondary | ICD-10-CM

## 2023-11-21 DIAGNOSIS — H35033 Hypertensive retinopathy, bilateral: Secondary | ICD-10-CM

## 2023-11-21 DIAGNOSIS — Z961 Presence of intraocular lens: Secondary | ICD-10-CM

## 2023-11-21 DIAGNOSIS — I10 Essential (primary) hypertension: Secondary | ICD-10-CM

## 2023-11-21 DIAGNOSIS — H33193 Other retinoschisis and retinal cysts, bilateral: Secondary | ICD-10-CM

## 2023-11-22 ENCOUNTER — Encounter: Payer: Self-pay | Admitting: Nurse Practitioner

## 2023-11-22 ENCOUNTER — Non-Acute Institutional Stay: Admitting: Nurse Practitioner

## 2023-11-22 VITALS — BP 116/78 | HR 81 | Temp 98.4°F | Ht 62.0 in | Wt 143.0 lb

## 2023-11-22 DIAGNOSIS — E785 Hyperlipidemia, unspecified: Secondary | ICD-10-CM

## 2023-11-22 DIAGNOSIS — K219 Gastro-esophageal reflux disease without esophagitis: Secondary | ICD-10-CM

## 2023-11-22 DIAGNOSIS — R7303 Prediabetes: Secondary | ICD-10-CM

## 2023-11-22 DIAGNOSIS — N952 Postmenopausal atrophic vaginitis: Secondary | ICD-10-CM

## 2023-11-22 DIAGNOSIS — E559 Vitamin D deficiency, unspecified: Secondary | ICD-10-CM

## 2023-11-22 DIAGNOSIS — Z780 Asymptomatic menopausal state: Secondary | ICD-10-CM

## 2023-11-22 DIAGNOSIS — E039 Hypothyroidism, unspecified: Secondary | ICD-10-CM

## 2023-11-22 DIAGNOSIS — K5901 Slow transit constipation: Secondary | ICD-10-CM

## 2023-11-22 DIAGNOSIS — M8580 Other specified disorders of bone density and structure, unspecified site: Secondary | ICD-10-CM

## 2023-11-22 MED ORDER — LACTULOSE 20 GM/30ML PO SOLN
20.0000 g | Freq: Every day | ORAL | 1 refills | Status: AC
Start: 2023-11-22 — End: ?

## 2023-11-22 NOTE — Assessment & Plan Note (Signed)
 takes D, Vit D level 37 12/19/22

## 2023-11-22 NOTE — Assessment & Plan Note (Addendum)
 takes Fish oil, trial off ASA 81mg  every day duet GERD, LDL 106 12/19/22, pending lipid panel.

## 2023-11-22 NOTE — Assessment & Plan Note (Signed)
managed with Estradiol 0.25mg qd.     

## 2023-11-22 NOTE — Assessment & Plan Note (Addendum)
 stable, prn Famotidine  20mg  bid. Hgb 13.9 12/19/22, pending CBC/diff

## 2023-11-22 NOTE — Assessment & Plan Note (Signed)
 DEXA t score -1.9 11/14/22, continue Ca, Vit D,              Hx of Fosamax didn't tolerate, GI symptoms.

## 2023-11-22 NOTE — Assessment & Plan Note (Signed)
 on Levothyroxine  75mcg qd, TSH 4.65 12/19/22, pending TSH, CMP, lipids

## 2023-11-22 NOTE — Progress Notes (Signed)
 Location:   Clinic [facility]   Place of Service:  Clinic (12) Provider: [facility] Teghan Philbin NP  Darnise Montag X, NP  Patient Care Team: Jveon Pound X, NP as PCP - General (Internal Medicine)  Extended Emergency Contact Information Primary Emergency Contact: Venkatesh,Scott Address: [address]          [place], [place] [address] [place] Home Phone: [phone] Mobile Phone: [phone] Relation: Son Secondary Emergency Contact: [facility] Address: [address]          [place], [place] [address] [place] Mobile Phone: [phone] Relation: Son  Code Status:  DNR Goals of care: Advanced Directive information    04/26/2023   12:55 PM  Advanced Directives  Does Patient Have a Medical Advance Directive? Yes  Type of Estate agent of [place];Out of facility DNR (pink MOST or yellow form)  Does patient want to make changes to medical advance directive? No - Patient declined  Copy of Healthcare Power of Attorney in Chart? Yes - validated most recent copy scanned in chart (See row information)  Pre-existing out of facility DNR order (yellow form or pink MOST form) Yellow form placed in chart (order not valid for inpatient use);Pink MOST form placed in chart (order not valid for inpatient use)     Chief Complaint  Patient presents with  . Medical Management of Chronic Issues    6 month follow-up. Discussed scheduling annual wellness visit, pending for 12/20/23.  Discuss starting lactulose . Discuss colon screening.     HPI:  Pt is a 83 y.o. female seen today for medical management of chronic diseases.      Prediabetes Hgb A1c 5.8 12/19/22 GERD, stable, prn Famotidine , Hgb 13.9 12/19/22             Postmenopausal symptoms, managed with Estradiol  0.25mg  qd.        Hypothyroidism, on Levothyroxine  75mcg qd, TSH 4.65 12/19/22             Hyperlipidemia, takes Fish oil, try off ASA 81mg  qd, LDL 106 12/19/22             Constipation, MiraLax qod, feels  floated sometimes, Linzess  is too strong.              Vitamin D  deficiency, takes D, Vit D level 37 12/19/22             OP DEXA t score -1.9 11/14/22, continue Ca, Vit D,              Hx of Fosamax didn't tolerate, GI symptoms.    Past Medical History:  Diagnosis Date  . Arthritis   . Chronic constipation   . CKD (chronic kidney disease)   . Gallstones   . History of kidney stones   . Interstitial cystitis   . Thyroid disease   . UTI (urinary tract infection)    Past Surgical History:  Procedure Laterality Date  . CATARACT EXTRACTION Bilateral   . CHOLECYSTECTOMY    . EYE SURGERY Bilateral    Cat Sx  . PARTIAL HYSTERECTOMY    . TUBAL LIGATION      Allergies  Allergen Reactions  . Ciprofloxacin   . Diflucan [Fluconazole]   . Sulfa Antibiotics     Allergies as of 11/22/2023       Reactions   Ciprofloxacin    Diflucan [fluconazole]    Sulfa Antibiotics         Medication List  Accurate as of November 22, 2023  4:03 PM. If you have any questions, ask your nurse or doctor.          STOP taking these medications    Abrysvo 120 MCG/0.5ML injection Generic drug: RSV bivalent vaccine Stopped by: Stoy Fenn X Kae Lauman   aspirin EC 81 MG tablet Stopped by: Germain Koopmann X Vinita Prentiss   GLUCOSAMINE 1500 COMPLEX PO Stopped by: Mayerly Kaman X Jobeth Pangilinan   meloxicam  7.5 MG tablet Commonly known as: MOBIC  Stopped by: Jaquae Rieves X Ling Flesch   miconazole  2 % vaginal cream Commonly known as: MONISTAT  7 Stopped by: Khalaya Mcgurn X Shoshanna Mcquitty   Shingrix injection Generic drug: Zoster Vaccine Adjuvanted Stopped by: Stephanieann Popescu X Jazzman Loughmiller   Spikevax syringe Generic drug: COVID-19 mRNA vaccine Stopped by: Amneet Cendejas X Katlyn Muldrew       TAKE these medications    amoxicillin 500 MG capsule Commonly known as: AMOXIL Take 500 mg by mouth 3 (three) times daily.   calcium carbonate 1500 (600 Ca) MG Tabs tablet Commonly known as: OSCAL Take 600 mg of elemental calcium by mouth daily with breakfast.   EQL Vitamin D3 50 MCG (2000 UT) Caps Generic  drug: Cholecalciferol Take 2,000 Units by mouth daily.   estradiol  1 MG tablet Commonly known as: ESTRACE  TAKE 1 TABLET BY MOUTH THREE TIMES A WEEK   FISH OIL PO Take 2,000 mg by mouth daily.   Lactulose  20 GM/30ML Soln Take 30 mLs (20 g total) by mouth daily. Started by: Nalani Andreen X Lakina Mcintire   levothyroxine  75 MCG tablet Commonly known as: SYNTHROID  TAKE 1/2 (ONE-HALF) TABLET BY MOUTH ONCE DAILY BEFORE BREAKFAST   MULTIVITAMIN ADULT PO Take 20 mg by mouth.   multivitamin-lutein Caps capsule Take 1 capsule by mouth daily.   polyethylene glycol 17 g packet Commonly known as: MIRALAX / GLYCOLAX Take 17 g by mouth. Every other night        Review of Systems  Constitutional:  Negative for activity change, appetite change and fever.  HENT:  Positive for hearing loss. Negative for congestion and voice change.   Eyes:  Negative for visual disturbance.  Respiratory:  Negative for cough and shortness of breath.   Cardiovascular:  Negative for leg swelling.  Gastrointestinal:  Negative for abdominal pain, constipation, nausea and vomiting.       Bloated after Laxatives.   Genitourinary:  Negative for difficulty urinating, dysuria, urgency and vaginal pain.       Urination x1/night.   Musculoskeletal:  Negative for gait problem.  Skin:  Negative for color change.  Neurological:  Negative for speech difficulty, weakness and headaches.  Psychiatric/Behavioral:  Negative for behavioral problems and sleep disturbance. The patient is not nervous/anxious.        5 hours average sleep at night.     Immunization History  Administered Date(s) Administered  . Fluad Quad(high Dose 65+) 03/07/2021, 03/14/2022  . Influenza, High Dose Seasonal PF 02/20/2019, 03/03/2020, 03/21/2023  . Moderna Covid-19 Vaccine Bivalent Booster 54yrs & up 10/07/2021, 03/15/2022, 03/06/2023  . Moderna SARS-COV2 Booster Vaccination 03/30/2020, 10/19/2020  . Moderna Sars-Covid-2 Vaccination 05/26/2019, 06/23/2019  .  Pfizer Covid-19 Vaccine Bivalent Booster 34yrs & up 02/08/2021  . Pneumococcal Conjugate-13 05/22/2013  . Pneumococcal Polysaccharide-23 05/22/2014  . Tdap 05/22/2020  . Zoster Recombinant(Shingrix) 10/13/2021, 02/08/2022  . Zoster, Live 10/20/2009   Pertinent  Health Maintenance Due  Topic Date Due  . INFLUENZA VACCINE  12/21/2023  . DEXA SCAN  Completed      08/03/2022    3:19 PM 08/23/2022  11:14 AM 10/26/2022   12:42 PM 04/26/2023   12:58 PM 11/22/2023   12:56 PM  Fall Risk  Falls in the past year? 0 0 0 0 0  Was there an injury with Fall?  0 0 0 0  Fall Risk Category Calculator  0 0 0 0  Patient at Risk for Falls Due to  No Fall Risks No Fall Risks  No Fall Risks  Fall risk Follow up  Falls evaluation completed Falls evaluation completed  Falls evaluation completed   Functional Status Survey:    Vitals:   11/22/23 0837  BP: 116/78  Pulse: 81  Temp: 98.4 F (36.9 C)  SpO2: 96%  Weight: 143 lb (64.9 kg)  Height: 5' 2 (1.575 m)   Body mass index is 26.16 kg/m. Physical Exam Vitals and nursing note reviewed.  Constitutional:      Appearance: Normal appearance.     Comments: Over weight.   HENT:     Head: Normocephalic and atraumatic.     Mouth/Throat:     Mouth: Mucous membranes are moist.  Eyes:     Extraocular Movements: Extraocular movements intact.     Conjunctiva/sclera: Conjunctivae normal.     Pupils: Pupils are equal, round, and reactive to light.  Cardiovascular:     Rate and Rhythm: Normal rate and regular rhythm.     Heart sounds: No murmur heard. Pulmonary:     Effort: Pulmonary effort is normal.     Breath sounds: No rales.  Abdominal:     Palpations: Abdomen is soft.     Tenderness: There is no abdominal tenderness.     Comments: Shifting dullness on percussion.   Musculoskeletal:     Cervical back: Normal range of motion and neck supple.     Right lower leg: No edema.     Left lower leg: No edema.  Skin:    General: Skin is warm and dry.   Neurological:     General: No focal deficit present.     Mental Status: She is alert and oriented to person, place, and time. Mental status is at baseline.     Gait: Gait normal.  Psychiatric:        Mood and Affect: Mood normal.        Behavior: Behavior normal.        Thought Content: Thought content normal.        Judgment: Judgment normal.     Labs reviewed: Recent Labs    12/19/22 0711  NA 139  K 4.5  CL 102  CO2 24  GLUCOSE 91  BUN 13  CREATININE 0.74  CALCIUM 9.3   Recent Labs    12/19/22 0711  AST 25  ALT 25  BILITOT 0.4  PROT 6.8   Recent Labs    12/19/22 0711  WBC 4.5  NEUTROABS 2,484  HGB 13.9  HCT 40.7  MCV 91.1  PLT 241   Lab Results  Component Value Date   TSH 4.65 (H) 12/19/2022   Lab Results  Component Value Date   HGBA1C 5.8 (H) 12/19/2022   Lab Results  Component Value Date   CHOL 187 12/19/2022   HDL 61 12/19/2022   LDLCALC 106 (H) 12/19/2022   TRIG 105 12/19/2022   CHOLHDL 3.1 12/19/2022    Significant Diagnostic Results in last 30 days:  No results found.  Assessment/Plan  Prediabetes Hgb A1c 5.8 12/19/22, pending Hgb A1c.   GERD (gastroesophageal reflux disease)  stable, prn Famotidine  20mg   bid. Hgb 13.9 12/19/22, pending CBC/diff  Atrophic vaginitis  managed with Estradiol  0.25mg  qd.         Hypothyroidism  on Levothyroxine  75mcg qd, TSH 4.65 12/19/22, pending TSH, CMP, lipids  Hyperlipidemia takes Fish oil, trial off ASA 81mg  every day duet GERD, LDL 106 12/19/22, pending lipid panel.   Slow transit constipation Managed, on  MiraLax qod, feels floated sometimes, Linzess  is too strong.   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      Family/ staff Communication: plan of care reviewed with the patient  Labs/tests ordered:  labs, FOBT

## 2023-11-22 NOTE — Assessment & Plan Note (Signed)
 Managed, on  MiraLax qod, feels floated sometimes, Linzess  is too strong.

## 2023-11-22 NOTE — Patient Instructions (Signed)
 Fasting labs Tuesday 11/27/2023 @ 7:00 am at the [facility]

## 2023-11-22 NOTE — Assessment & Plan Note (Signed)
 Hgb A1c 5.8 12/19/22, pending Hgb A1c.

## 2023-11-26 ENCOUNTER — Other Ambulatory Visit: Payer: Self-pay

## 2023-11-26 DIAGNOSIS — Z800 Family history of malignant neoplasm of digestive organs: Secondary | ICD-10-CM

## 2023-11-27 DIAGNOSIS — Z800 Family history of malignant neoplasm of digestive organs: Secondary | ICD-10-CM | POA: Diagnosis not present

## 2023-11-27 DIAGNOSIS — E785 Hyperlipidemia, unspecified: Secondary | ICD-10-CM | POA: Diagnosis not present

## 2023-11-27 DIAGNOSIS — E559 Vitamin D deficiency, unspecified: Secondary | ICD-10-CM | POA: Diagnosis not present

## 2023-11-27 DIAGNOSIS — K219 Gastro-esophageal reflux disease without esophagitis: Secondary | ICD-10-CM | POA: Diagnosis not present

## 2023-11-27 DIAGNOSIS — R7303 Prediabetes: Secondary | ICD-10-CM | POA: Diagnosis not present

## 2023-11-27 DIAGNOSIS — E039 Hypothyroidism, unspecified: Secondary | ICD-10-CM | POA: Diagnosis not present

## 2023-11-28 ENCOUNTER — Encounter (INDEPENDENT_AMBULATORY_CARE_PROVIDER_SITE_OTHER): Payer: Self-pay | Admitting: Ophthalmology

## 2023-11-28 ENCOUNTER — Ambulatory Visit (INDEPENDENT_AMBULATORY_CARE_PROVIDER_SITE_OTHER): Admitting: Ophthalmology

## 2023-11-28 DIAGNOSIS — Z961 Presence of intraocular lens: Secondary | ICD-10-CM

## 2023-11-28 DIAGNOSIS — H33193 Other retinoschisis and retinal cysts, bilateral: Secondary | ICD-10-CM

## 2023-11-28 DIAGNOSIS — H35373 Puckering of macula, bilateral: Secondary | ICD-10-CM | POA: Diagnosis not present

## 2023-11-28 DIAGNOSIS — I10 Essential (primary) hypertension: Secondary | ICD-10-CM

## 2023-11-28 DIAGNOSIS — H35033 Hypertensive retinopathy, bilateral: Secondary | ICD-10-CM

## 2023-11-30 ENCOUNTER — Other Ambulatory Visit: Payer: Self-pay | Admitting: Nurse Practitioner

## 2023-12-01 LAB — LIPID PANEL
Cholesterol: 183 mg/dL (ref <200)
HDL: 61 mg/dL (ref >=50)
LDL Cholesterol (Calc): 103 mg/dL — ABNORMAL HIGH
Non-HDL Cholesterol (Calc): 122 mg/dL (ref <130)
Total CHOL/HDL Ratio: 3 (calc) (ref <5.0)
Triglycerides: 93 mg/dL (ref <150)

## 2023-12-01 LAB — CBC WITH DIFFERENTIAL/PLATELET
Absolute Lymphocytes: 1101 {cells}/uL (ref 850–3900)
Absolute Monocytes: 305 {cells}/uL (ref 200–950)
Basophils Absolute: 30 {cells}/uL (ref 0–200)
Basophils Relative: 0.7 %
Eosinophils Absolute: 133 {cells}/uL (ref 15–500)
Eosinophils Relative: 3.1 %
HCT: 40.8 % (ref 35.0–45.0)
Hemoglobin: 13.6 g/dL (ref 11.7–15.5)
MCH: 31.1 pg (ref 27.0–33.0)
MCHC: 33.3 g/dL (ref 32.0–36.0)
MCV: 93.4 fL (ref 80.0–100.0)
MPV: 10.4 fL (ref 7.5–12.5)
Monocytes Relative: 7.1 %
Neutro Abs: 2731 {cells}/uL (ref 1500–7800)
Neutrophils Relative %: 63.5 %
Platelets: 230 Thousand/uL (ref 140–400)
RBC: 4.37 Million/uL (ref 3.80–5.10)
RDW: 12.3 % (ref 11.0–15.0)
Total Lymphocyte: 25.6 %
WBC: 4.3 Thousand/uL (ref 3.8–10.8)

## 2023-12-01 LAB — HEMOGLOBIN A1C
Hgb A1c MFr Bld: 5.8 % — ABNORMAL HIGH (ref <5.7)
Mean Plasma Glucose: 120 mg/dL
eAG (mmol/L): 6.6 mmol/L

## 2023-12-01 LAB — VITAMIN D 1,25 DIHYDROXY
Vitamin D 1, 25 (OH)2 Total: 39 pg/mL (ref 18–72)
Vitamin D2 1, 25 (OH)2: <8 pg/mL
Vitamin D3 1, 25 (OH)2: 39 pg/mL

## 2023-12-01 LAB — TSH: TSH: 3.96 m[IU]/L (ref 0.40–4.50)

## 2023-12-02 ENCOUNTER — Encounter (INDEPENDENT_AMBULATORY_CARE_PROVIDER_SITE_OTHER): Payer: Self-pay | Admitting: Ophthalmology

## 2023-12-03 ENCOUNTER — Ambulatory Visit: Payer: Self-pay | Admitting: Nurse Practitioner

## 2023-12-20 ENCOUNTER — Ambulatory Visit: Admitting: Nurse Practitioner

## 2023-12-20 ENCOUNTER — Encounter: Payer: Self-pay | Admitting: Nurse Practitioner

## 2023-12-20 VITALS — BP 108/76 | HR 87 | Temp 97.8°F | Resp 14 | Ht 62.0 in | Wt 142.4 lb

## 2023-12-20 DIAGNOSIS — K921 Melena: Secondary | ICD-10-CM | POA: Diagnosis not present

## 2023-12-20 DIAGNOSIS — Z0000 Encounter for general adult medical examination without abnormal findings: Secondary | ICD-10-CM | POA: Diagnosis not present

## 2023-12-20 NOTE — Progress Notes (Unsigned)
 Subjective:   Claudia Hunt is a 83 y.o. female who presents for Medicare Annual (Subsequent) preventive examination in Clinic [facility]  Visit Complete: In person  Patient Medicare AWV questionnaire was completed by the patient on 12/20/23; I have confirmed that all information answered by patient is correct and no changes since this date.  Cardiac Risk Factors include: advanced age (>71men, >39 women);dyslipidemia;hypertension     Objective:    Today's Vitals   12/20/23 1305  BP: 108/76  Pulse: 87  Resp: 14  Temp: 97.8 F (36.6 C)  SpO2: 97%  Weight: 142 lb 6.4 oz (64.6 kg)  Height: 5' 2 (1.575 m)   Body mass index is 26.05 kg/m.     04/26/2023   12:55 PM 10/26/2022   12:41 PM 08/03/2022    3:02 PM 04/20/2022   10:13 AM 12/15/2021   10:47 AM 09/27/2021   10:50 AM 07/26/2021    8:58 AM  Advanced Directives  Does Patient Have a Medical Advance Directive? Yes Yes Yes Yes Yes Yes Yes  Type of Estate agent of [place];Out of facility DNR (pink MOST or yellow form) Living will;Out of facility DNR (pink MOST or yellow form) Living will;Healthcare Power of Attorney Living will;Out of facility DNR (pink MOST or yellow form) Out of facility DNR (pink MOST or yellow form);Living will;Healthcare Power of [employer] of [place];Living will Healthcare Power of [place];Out of facility DNR (pink MOST or yellow form)  Does patient want to make changes to medical advance directive? No - Patient declined No - Patient declined No - Patient declined No - Patient declined No - Patient declined No - Patient declined No - Patient declined  Copy of Healthcare Power of Attorney in Chart? Yes - validated most recent copy scanned in chart (See row information)    Yes - validated most recent copy scanned in chart (See row information) Yes - validated most recent copy scanned in chart (See row information) Yes - validated most recent copy scanned in chart (See row information)   Pre-existing out of facility DNR order (yellow form or pink MOST form) Yellow form placed in chart (order not valid for inpatient use);Pink MOST form placed in chart (order not valid for inpatient use) Yellow form placed in chart (order not valid for inpatient use)   Pink MOST form placed in chart (order not valid for inpatient use)  Pink MOST form placed in chart (order not valid for inpatient use)    Current Medications (verified) Outpatient Encounter Medications as of 12/20/2023  Medication Sig   calcium carbonate (OSCAL) 1500 (600 Ca) MG TABS tablet Take 600 mg of elemental calcium by mouth daily with breakfast.   Cholecalciferol (EQL VITAMIN D3) 50 MCG (2000 UT) CAPS Take 2,000 Units by mouth daily.   estradiol  (ESTRACE ) 1 MG tablet TAKE 1 TABLET BY MOUTH THREE TIMES A WEEK   Lactulose  20 GM/30ML SOLN Take 30 mLs (20 g total) by mouth daily.   levothyroxine  (SYNTHROID ) 75 MCG tablet TAKE 1/2 (ONE-HALF) TABLET BY MOUTH ONCE DAILY BEFORE BREAKFAST   Multiple Vitamin (MULTIVITAMIN ADULT PO) Take 20 mg by mouth.   multivitamin-lutein (OCUVITE-LUTEIN) CAPS capsule Take 1 capsule by mouth daily.   Omega-3 Fatty Acids (FISH OIL PO) Take 2,000 mg by mouth daily.   polyethylene glycol (MIRALAX / GLYCOLAX) 17 g packet Take 17 g by mouth. Every other night   amoxicillin (AMOXIL) 500 MG capsule Take 500 mg by mouth 3 (three) times daily. (Patient  not taking: Reported on 12/20/2023)   No facility-administered encounter medications on file as of 12/20/2023.    Allergies (verified) Ciprofloxacin, Diflucan [fluconazole], and Sulfa antibiotics   History: Past Medical History:  Diagnosis Date   Arthritis    Chronic constipation    CKD (chronic kidney disease)    Gallstones    History of kidney stones    Interstitial cystitis    Thyroid disease    UTI (urinary tract infection)    Past Surgical History:  Procedure Laterality Date   CATARACT EXTRACTION Bilateral    CHOLECYSTECTOMY     EYE  SURGERY Bilateral    Cat Sx   PARTIAL HYSTERECTOMY     TUBAL LIGATION     Family History  Problem Relation Age of Onset   Cancer Father    Diabetes Father    Prostate cancer Brother    Colon cancer Paternal Uncle    Social History   Socioeconomic History   Marital status: Widowed    Spouse name: Not on file   Number of children: 3   Years of education: Not on file   Highest education level: Not on file  Occupational History   Occupation: retired Runner, broadcasting/film/video   Tobacco Use   Smoking status: Never   Smokeless tobacco: Never  Vaping Use   Vaping status: Never Used  Substance and Sexual Activity   Alcohol use: Yes    Alcohol/week: 3.0 standard drinks of alcohol    Types: 3 Glasses of wine per week    Comment: weekly   Drug use: Never   Sexual activity: Not on file  Other Topics Concern   Not on file  Social History Narrative   Not on file   Social Drivers of Health   Financial Resource Strain: Not on file  Food Insecurity: Not on file  Transportation Needs: Not on file  Physical Activity: Not on file  Stress: Not on file  Social Connections: Not on file    Tobacco Counseling Counseling given: Not Answered   Clinical Intake:  Pre-visit preparation completed: Yes  Pain : No/denies pain     BMI - recorded: 26.05 Nutritional Status: BMI 25 -29 Overweight Nutritional Risks: None Diabetes: No  How often do you need to have someone help you when you read instructions, pamphlets, or other written materials from your doctor or pharmacy?: 2 - Rarely What is the last grade level you completed in school?: master in science  Interpreter Needed?: No  Information entered by :: Aly Seidenberg Lorenda Hark NP   Activities of Daily Living    12/20/2023    1:17 PM  In your present state of health, do you have any difficulty performing the following activities:  Hearing? 0  Vision? 0  Difficulty concentrating or making decisions? 0  Walking or climbing stairs? 0  Dressing or  bathing? 0  Doing errands, shopping? 0  Preparing Food and eating ? N  Using the Toilet? N  In the past six months, have you accidently leaked urine? N  Do you have problems with loss of bowel control? N  Managing your Medications? N  Managing your Finances? N  Housekeeping or managing your Housekeeping? N    Patient Care Team: Akirra Lacerda X, NP as PCP - General (Internal Medicine)  Indicate any recent Medical Services you may have received from other than [facility] providers in the past year (date may be approximate).     Assessment:   This is a routine wellness examination for [place].  Hearing/Vision screen Hearing Screening - Comments:: No problems with hearing  Vision Screening - Comments:: No problems with vision    Goals Addressed             This Visit's Progress    Functional Decline Minimized       Evidence-based guidance:  Assess fall risk, including balance and gait impairment, muscle weakness, diminished vision or hearing, environmental hazards, effects of medication and dehydration.  Assess and review gait speed, decreased muscle strength or impaired functional mobility; consider use of validated tool if available.  Prepare patient for rehabilitation services to develop an individualized activity and exercise program as indicated, such as progressive resistance strengthening, balance and activity tolerance training or home exercise program.  Prevent falls with environmental adjustment; encourage compliance with exercise program, management of incontinence and adequate vitamin D  and calcium intake from food or supplements.  Promote gradual increase in intensity of activity and exercise as tolerated, such as duration, frequency, exercise repetition and sets and intensity.  Provide guidance and interventions aimed at fall prevention.  Review or assess presence of reduced muscle mass or results of dual-energy x-ray absorptiometry.   Notes:        Depression Screen     12/20/2023    1:22 PM 11/22/2023   12:53 PM 04/26/2023   12:58 PM 08/23/2022   11:14 AM 08/03/2022    3:19 PM 08/03/2022    3:01 PM 07/26/2021    8:55 AM  PHQ 2/9 Scores  PHQ - 2 Score 0 0 0 0 0 0 0    Fall Risk    11/22/2023   12:56 PM 04/26/2023   12:58 PM 10/26/2022   12:42 PM 08/23/2022   11:14 AM 08/03/2022    3:19 PM  Fall Risk   Falls in the past year? 0 0 0 0 0  Number falls in past yr: 0 0 0 0   Injury with Fall? 0 0 0 0   Risk for fall due to : No Fall Risks  No Fall Risks No Fall Risks   Follow up Falls evaluation completed  Falls evaluation completed Falls evaluation completed     MEDICARE RISK AT HOME: Medicare Risk at Home Any stairs in or around the home?: Yes If so, are there any without handrails?: No Home free of loose throw rugs in walkways, pet beds, electrical cords, etc?: Yes Adequate lighting in your home to reduce risk of falls?: Yes Life alert?: No Use of a cane, walker or w/c?: No Grab bars in the bathroom?: Yes Shower chair or bench in shower?: Yes Elevated toilet seat or a handicapped toilet?: Yes  TIMED UP AND GO:  Was the test performed?  No    Cognitive Function:    08/03/2022    3:03 PM  MMSE - Mini Mental State Exam  Orientation to time 5  Orientation to Place 5  Registration 3  Attention/ Calculation 5  Recall 3  Language- name 2 objects 2  Language- repeat 1  Language- follow 3 step command 3  Language- read & follow direction 1  Write a sentence 1  Copy design 1  Total score 30        12/20/2023    1:03 PM 07/26/2021    8:59 AM 07/23/2020    1:15 PM  6CIT Screen  What Year? 0 points 0 points 0 points  What month? 0 points 0 points 0 points  What time? 0 points 0 points 0 points  Count back  from 20 0 points 0 points 0 points  Months in reverse 0 points 0 points 0 points  Repeat phrase 0 points 2 points 0 points  Total Score 0 points 2 points 0 points    Immunizations Immunization History  Administered Date(s) Administered    Fluad Quad(high Dose 65+) 03/07/2021, 03/14/2022   Influenza, High Dose Seasonal PF 02/20/2019, 03/03/2020, 03/21/2023   Moderna Covid-19 Vaccine Bivalent Booster 43yrs & up 10/07/2021, 03/15/2022, 03/06/2023   Moderna SARS-COV2 Booster Vaccination 03/30/2020, 10/19/2020   Moderna Sars-Covid-2 Vaccination 05/26/2019, 06/23/2019   Pfizer Covid-19 Vaccine Bivalent Booster 35yrs & up 02/08/2021   Pneumococcal Conjugate-13 05/22/2013   Pneumococcal Polysaccharide-23 05/22/2014   Tdap 05/22/2020   Zoster Recombinant(Shingrix) 10/13/2021, 02/08/2022   Zoster, Live 10/20/2009    TDAP status: Up to date  Flu Vaccine status: Up to date  Pneumococcal vaccine status: Up to date  Covid-19 vaccine status: Information provided on how to obtain vaccines.   Qualifies for Shingles Vaccine? Yes   Zostavax completed Yes   Shingrix Completed?: Yes  Screening Tests Health Maintenance  Topic Date Due   INFLUENZA VACCINE  12/21/2023   COVID-19 Vaccine (9 - 2024-25 season) 03/20/2024 (Originally 05/01/2023)   Medicare Annual Wellness (AWV)  12/19/2024   DTaP/Tdap/Td (2 - Td or Tdap) 05/22/2030   Pneumococcal Vaccine: 50+ Years  Completed   DEXA SCAN  Completed   Zoster Vaccines- Shingrix  Completed   Hepatitis B Vaccines  Aged Out   HPV VACCINES  Aged Out   Meningococcal B Vaccine  Aged Out    Health Maintenance  Health Maintenance Due  Topic Date Due   INFLUENZA VACCINE  12/21/2023     Colorectal cancer screening: No longer required.   Mammogram status: No longer required due to  .  Bone Density status: Ordered 12/20/23. Pt provided with contact info and advised to call to schedule appt.  Lung Cancer Screening: (Low Dose CT Chest recommended if Age 79-80 years, 20 pack-year currently smoking OR have quit w/in 15years.) does not qualify.   Lung Cancer Screening Referral: NA  Additional Screening:  Hepatitis C Screening: does not qualify;  Vision Screening: Recommended annual  ophthalmology exams for early detection of glaucoma and other disorders of the eye. Is the patient up to date with their annual eye exam?  Yes  Who is the provider or what is the name of the office in which the patient attends annual eye exams? Dr. Romie street If pt is not established with a provider, would they like to be referred to a provider to establish care? No .   Dental Screening: Recommended annual dental exams for proper oral hygiene  Diabetic Foot Exam: NA  Community Resource Referral / Chronic Care Management: CRR required this visit?  No   CCM required this visit?  No     Plan:     I have personally reviewed and noted the following in the patient's chart:   Medical and social history Use of alcohol, tobacco or illicit drugs  Current medications and supplements including opioid prescriptions. Patient is not currently taking opioid prescriptions. Functional ability and status Nutritional status Physical activity Advanced directives List of other physicians Hospitalizations, surgeries, and ER visits in previous 12 months Vitals Screenings to include cognitive, depression, and falls Referrals and appointments  In addition, I have reviewed and discussed with patient certain preventive protocols, quality metrics, and best practice recommendations. A written personalized care plan for preventive services as well as general preventive health recommendations  were provided to patient.     Elanah Osmanovic X Byrant Valent, NP   12/21/2023   After Visit Summary: (In Person-Printed) AVS printed and given to the patient

## 2024-01-03 LAB — FECAL GLOBIN BY IMMUNOCHEMISTRY
FECAL GLOBIN RESULT:: NOT DETECTED
MICRO NUMBER:: 16829519
SPECIMEN QUALITY:: ADEQUATE

## 2024-01-04 ENCOUNTER — Ambulatory Visit: Payer: Self-pay | Admitting: Nurse Practitioner

## 2024-01-15 LAB — FECAL GLOBIN BY IMMUNOCHEMISTRY

## 2024-01-24 DIAGNOSIS — M9901 Segmental and somatic dysfunction of cervical region: Secondary | ICD-10-CM | POA: Diagnosis not present

## 2024-01-24 DIAGNOSIS — M9902 Segmental and somatic dysfunction of thoracic region: Secondary | ICD-10-CM | POA: Diagnosis not present

## 2024-01-24 DIAGNOSIS — M5382 Other specified dorsopathies, cervical region: Secondary | ICD-10-CM | POA: Diagnosis not present

## 2024-01-24 DIAGNOSIS — M5384 Other specified dorsopathies, thoracic region: Secondary | ICD-10-CM | POA: Diagnosis not present

## 2024-02-07 DIAGNOSIS — M9901 Segmental and somatic dysfunction of cervical region: Secondary | ICD-10-CM | POA: Diagnosis not present

## 2024-02-07 DIAGNOSIS — M5382 Other specified dorsopathies, cervical region: Secondary | ICD-10-CM | POA: Diagnosis not present

## 2024-02-07 DIAGNOSIS — M5384 Other specified dorsopathies, thoracic region: Secondary | ICD-10-CM | POA: Diagnosis not present

## 2024-02-07 DIAGNOSIS — M9902 Segmental and somatic dysfunction of thoracic region: Secondary | ICD-10-CM | POA: Diagnosis not present

## 2024-02-27 ENCOUNTER — Other Ambulatory Visit: Payer: Self-pay | Admitting: Nurse Practitioner

## 2024-02-27 NOTE — Telephone Encounter (Signed)
 Patient is requesting medication. Hasn't been filled since July 2025. Is medication okay to refill ?

## 2024-03-06 ENCOUNTER — Other Ambulatory Visit: Payer: Self-pay | Admitting: Nurse Practitioner

## 2024-03-06 DIAGNOSIS — E039 Hypothyroidism, unspecified: Secondary | ICD-10-CM

## 2024-03-13 DIAGNOSIS — M5384 Other specified dorsopathies, thoracic region: Secondary | ICD-10-CM | POA: Diagnosis not present

## 2024-03-13 DIAGNOSIS — M9902 Segmental and somatic dysfunction of thoracic region: Secondary | ICD-10-CM | POA: Diagnosis not present

## 2024-03-13 DIAGNOSIS — M9901 Segmental and somatic dysfunction of cervical region: Secondary | ICD-10-CM | POA: Diagnosis not present

## 2024-03-13 DIAGNOSIS — M5382 Other specified dorsopathies, cervical region: Secondary | ICD-10-CM | POA: Diagnosis not present

## 2024-04-10 DIAGNOSIS — M9902 Segmental and somatic dysfunction of thoracic region: Secondary | ICD-10-CM | POA: Diagnosis not present

## 2024-04-10 DIAGNOSIS — M9901 Segmental and somatic dysfunction of cervical region: Secondary | ICD-10-CM | POA: Diagnosis not present

## 2024-04-10 DIAGNOSIS — M5382 Other specified dorsopathies, cervical region: Secondary | ICD-10-CM | POA: Diagnosis not present

## 2024-04-10 DIAGNOSIS — M5384 Other specified dorsopathies, thoracic region: Secondary | ICD-10-CM | POA: Diagnosis not present

## 2024-05-14 ENCOUNTER — Other Ambulatory Visit: Payer: Self-pay | Admitting: Nurse Practitioner

## 2024-06-05 ENCOUNTER — Other Ambulatory Visit: Payer: Self-pay | Admitting: Nurse Practitioner

## 2024-06-05 DIAGNOSIS — E039 Hypothyroidism, unspecified: Secondary | ICD-10-CM

## 2024-06-19 ENCOUNTER — Non-Acute Institutional Stay: Payer: Self-pay | Admitting: Nurse Practitioner

## 2024-06-19 ENCOUNTER — Encounter: Payer: Self-pay | Admitting: Nurse Practitioner

## 2024-06-19 VITALS — BP 112/74 | HR 77 | Temp 97.9°F | Resp 18 | Ht 62.0 in | Wt 144.6 lb

## 2024-06-19 DIAGNOSIS — E039 Hypothyroidism, unspecified: Secondary | ICD-10-CM | POA: Diagnosis not present

## 2024-06-19 DIAGNOSIS — Z780 Asymptomatic menopausal state: Secondary | ICD-10-CM | POA: Diagnosis not present

## 2024-06-19 DIAGNOSIS — K219 Gastro-esophageal reflux disease without esophagitis: Secondary | ICD-10-CM | POA: Diagnosis not present

## 2024-06-19 DIAGNOSIS — K5901 Slow transit constipation: Secondary | ICD-10-CM

## 2024-06-19 DIAGNOSIS — E785 Hyperlipidemia, unspecified: Secondary | ICD-10-CM | POA: Diagnosis not present

## 2024-06-19 DIAGNOSIS — M8580 Other specified disorders of bone density and structure, unspecified site: Secondary | ICD-10-CM

## 2024-06-19 DIAGNOSIS — R7303 Prediabetes: Secondary | ICD-10-CM | POA: Diagnosis not present

## 2024-06-19 DIAGNOSIS — E559 Vitamin D deficiency, unspecified: Secondary | ICD-10-CM

## 2024-06-19 NOTE — Assessment & Plan Note (Signed)
Postmenopausal symptoms, managed with Estradiol 0.25mg  qd.

## 2024-06-19 NOTE — Assessment & Plan Note (Signed)
MiraLax qod, feels floated sometimes, Linzess is too strong.

## 2024-06-19 NOTE — Assessment & Plan Note (Signed)
 Hgb A1c 5.8 11/27/23 Continue diet control.

## 2024-06-19 NOTE — Assessment & Plan Note (Signed)
 on Levothyroxine  75mcg qd, TSH 3.96 11/27/23

## 2024-06-19 NOTE — Assessment & Plan Note (Signed)
 stable, prn Famotidine , Hgb 13.6 11/27/23

## 2024-06-19 NOTE — Assessment & Plan Note (Signed)
 OP DEXA t score -1.9 11/14/22, continue Ca, Vit D, Hx of Fosamax didn't tolerate, GI symptoms.

## 2024-06-19 NOTE — Patient Instructions (Signed)
 F/u in clinic [facility] 6 months, labs prior.

## 2024-06-19 NOTE — Assessment & Plan Note (Signed)
 takes Fish oil, try off ASA 81mg  qd, LDL 103 11/27/23

## 2024-06-19 NOTE — Progress Notes (Signed)
 " Location:   [facility]   Place of Service:  Clinic (12) Provider: Larwance Shalae Belmonte NP  Nandika Stetzer X, NP  Patient Care Team: Burr Soffer X, NP as PCP - General (Internal Medicine)  Extended Emergency Contact Information Primary Emergency Contact: Wendland,Scott Address: [address]          [place], [place] [address] [place] Home Phone: [phone] Mobile Phone: [phone] Relation: Son Secondary Emergency Contact: [facility] Address: [address]          [place], [place] [address] [place] Mobile Phone: [phone] Relation: Son  Code Status:  DNR Goals of care: Advanced Directive information    06/19/2024    1:00 PM  Advanced Directives  Does Patient Have a Medical Advance Directive? Yes  Type of Estate Agent of [place];Out of facility DNR (pink MOST or yellow form)  Does patient want to make changes to medical advance directive? No - Patient declined  Copy of Healthcare Power of Attorney in Chart? Yes - validated most recent copy scanned in chart (See row information)  Pre-existing out of facility DNR order (yellow form or pink MOST form) Pink MOST/Yellow Form most recent copy in chart - Physician notified to receive inpatient order     Chief Complaint  Patient presents with   Medical Management of Chronic Issues    6 Month follow up.    HPI:  Pt is a 84 y.o. female seen today for medical management of chronic diseases.    Prediabetes Hgb A1c 5.8 11/27/23 GERD, stable, prn Famotidine , Hgb 13.6 11/27/23             Postmenopausal symptoms, managed with Estradiol  0.25mg  qd.        Hypothyroidism, on Levothyroxine  75mcg qd, TSH 3.96 11/27/23             Hyperlipidemia, takes Fish oil, try off ASA 81mg  qd, LDL 103 11/27/23             Constipation, MiraLax qod, feels floated sometimes, Linzess  is too strong.              Vitamin D  deficiency, takes D, Vit D level 39 11/27/23             OP DEXA t score -1.9 11/14/22, continue  Ca, Vit D,              Hx of Fosamax didn't tolerate, GI symptoms.     Past Medical History:  Diagnosis Date   Arthritis    Chronic constipation    CKD (chronic kidney disease)    Gallstones    History of kidney stones    Interstitial cystitis    Thyroid  disease    UTI (urinary tract infection)    Past Surgical History:  Procedure Laterality Date   CATARACT EXTRACTION Bilateral    CHOLECYSTECTOMY     EYE SURGERY Bilateral    Cat Sx   PARTIAL HYSTERECTOMY     TUBAL LIGATION      Allergies[1]  Allergies as of 06/19/2024       Reactions   Ciprofloxacin    Diflucan [fluconazole]    Sulfa Antibiotics         Medication List        Accurate as of June 19, 2024  4:19 PM. If you have any questions, ask your nurse or doctor.          amoxicillin 500 MG capsule Commonly known as: AMOXIL Take 500  mg by mouth 3 (three) times daily.   calcium carbonate 1500 (600 Ca) MG Tabs tablet Commonly known as: OSCAL Take 600 mg of elemental calcium by mouth daily with breakfast.   EQL Vitamin D3 50 MCG (2000 UT) Caps Generic drug: Cholecalciferol Take 2,000 Units by mouth daily.   estradiol  1 MG tablet Commonly known as: ESTRACE  TAKE 1 TABLET BY MOUTH THREE TIMES A WEEK   FISH OIL PO Take 2,000 mg by mouth daily.   Lactulose  20 GM/30ML Soln Take 30 mLs (20 g total) by mouth daily.   levothyroxine  75 MCG tablet Commonly known as: SYNTHROID  TAKE 1/2 (ONE-HALF) TABLET BY MOUTH ONCE DAILY BEFORE BREAKFAST   MULTIVITAMIN ADULT PO Take 20 mg by mouth.   multivitamin-lutein Caps capsule Take 1 capsule by mouth daily.   polyethylene glycol 17 g packet Commonly known as: MIRALAX / GLYCOLAX Take 17 g by mouth. Every other night        Review of Systems  Immunization History  Administered Date(s) Administered   Fluad Quad(high Dose 65+) 03/07/2021, 03/14/2022   INFLUENZA, HIGH DOSE SEASONAL PF 02/20/2019, 03/03/2020, 03/21/2023   Influenza-Unspecified  03/06/2024   Moderna Covid-19 Vaccine Bivalent Booster 3yrs & up 10/07/2021, 03/15/2022, 03/06/2023   Moderna SARS-COV2 Booster Vaccination 03/30/2020, 10/19/2020   Moderna Sars-Covid-2 Vaccination 05/26/2019, 06/23/2019   Pfizer Covid-19 Vaccine Bivalent Booster 76yrs & up 02/08/2021   Pneumococcal Conjugate-13 05/22/2013   Pneumococcal Polysaccharide-23 05/22/2014   Tdap 05/22/2020   Unspecified SARS-COV-2 Vaccination 03/13/2024   Zoster Recombinant(Shingrix) 10/13/2021, 02/08/2022   Zoster, Live 10/20/2009   Pertinent  Health Maintenance Due  Topic Date Due   Influenza Vaccine  Completed   Bone Density Scan  Completed      08/23/2022   11:14 AM 10/26/2022   12:42 PM 04/26/2023   12:58 PM 11/22/2023   12:56 PM 06/19/2024    1:00 PM  Fall Risk  Falls in the past year? 0 0 0 0 0  Was there an injury with Fall? 0  0  0  0  0  Fall Risk Category Calculator 0 0 0 0 0  Patient at Risk for Falls Due to No Fall Risks No Fall Risks  No Fall Risks No Fall Risks  Fall risk Follow up Falls evaluation completed Falls evaluation completed  Falls evaluation completed Falls evaluation completed     Data saved with a previous flowsheet row definition   Functional Status Survey:    Vitals:   06/19/24 1304  BP: 112/74  Pulse: 77  Resp: 18  Temp: 97.9 F (36.6 C)  SpO2: 99%  Weight: 144 lb 9.6 oz (65.6 kg)  Height: 5' 2 (1.575 m)   Body mass index is 26.45 kg/m. Physical Exam  Labs reviewed: No results for input(s): NA, K, CL, CO2, GLUCOSE, BUN, CREATININE, CALCIUM, MG, PHOS in the last 8760 hours. No results for input(s): AST, ALT, ALKPHOS, BILITOT, PROT, ALBUMIN in the last 8760 hours. Recent Labs    11/27/23 0740  WBC 4.3  NEUTROABS 2,731  HGB 13.6  HCT 40.8  MCV 93.4  PLT 230   Lab Results  Component Value Date   TSH 3.96 11/27/2023   Lab Results  Component Value Date   HGBA1C 5.8 (H) 11/27/2023   Lab Results  Component Value Date    CHOL 183 11/27/2023   HDL 61 11/27/2023   LDLCALC 103 (H) 11/27/2023   TRIG 93 11/27/2023   CHOLHDL 3.0 11/27/2023    Significant Diagnostic Results in  last 30 days:  No results found.  Assessment/Plan  Prediabetes Hgb A1c 5.8 11/27/23 Continue diet control.   GERD (gastroesophageal reflux disease) stable, prn Famotidine , Hgb 13.6 11/27/23  Postmenopausal estrogen deficiency   Postmenopausal symptoms, managed with Estradiol  0.25mg  qd.         Hypothyroidism on Levothyroxine  75mcg qd, TSH 3.96 11/27/23  Hyperlipidemia takes Fish oil, try off ASA 81mg  qd, LDL 103 11/27/23  Slow transit constipation MiraLax qod, feels floated sometimes, Linzess  is too strong.   Vitamin D  deficiency takes D, Vit D level 39 11/27/23  Osteopenia after menopause OP DEXA t score -1.9 11/14/22, continue Ca, Vit D, Hx of Fosamax didn't tolerate, GI symptoms.      Family/ staff Communication: plan of care reviewed with the patient.                                                                                                                                           Labs/tests ordered:  CBC/diff, CMp/eGFR, TSH, lipids, Hgb A1c, Vit B12, Vit D       [1]  Allergies Allergen Reactions   Ciprofloxacin    Diflucan [Fluconazole]    Sulfa Antibiotics    "

## 2024-06-19 NOTE — Assessment & Plan Note (Signed)
 takes D, Vit D level 39 11/27/23

## 2024-08-28 ENCOUNTER — Ambulatory Visit (HOSPITAL_BASED_OUTPATIENT_CLINIC_OR_DEPARTMENT_OTHER)

## 2024-12-18 ENCOUNTER — Encounter: Admitting: Nurse Practitioner
